# Patient Record
Sex: Female | Born: 1935 | Race: White | Hispanic: No | Marital: Married | Smoking: Never smoker
Health system: Southern US, Community
[De-identification: ages and names within clinical notes are randomized; demographics above are authoritative.]

## PROBLEM LIST (undated history)

## (undated) DIAGNOSIS — R413 Other amnesia: Secondary | ICD-10-CM

## (undated) DIAGNOSIS — M1990 Unspecified osteoarthritis, unspecified site: Secondary | ICD-10-CM

## (undated) DIAGNOSIS — R151 Fecal smearing: Secondary | ICD-10-CM

## (undated) DIAGNOSIS — E871 Hypo-osmolality and hyponatremia: Secondary | ICD-10-CM

## (undated) DIAGNOSIS — C787 Secondary malignant neoplasm of liver and intrahepatic bile duct: Secondary | ICD-10-CM

## (undated) DIAGNOSIS — R1312 Dysphagia, oropharyngeal phase: Secondary | ICD-10-CM

## (undated) DIAGNOSIS — K219 Gastro-esophageal reflux disease without esophagitis: Secondary | ICD-10-CM

## (undated) DIAGNOSIS — I10 Essential (primary) hypertension: Secondary | ICD-10-CM

## (undated) DIAGNOSIS — M5126 Other intervertebral disc displacement, lumbar region: Secondary | ICD-10-CM

## (undated) DIAGNOSIS — E785 Hyperlipidemia, unspecified: Secondary | ICD-10-CM

## (undated) DIAGNOSIS — D3A8 Other benign neuroendocrine tumors: Secondary | ICD-10-CM

## (undated) DIAGNOSIS — R269 Unspecified abnormalities of gait and mobility: Secondary | ICD-10-CM

## (undated) DIAGNOSIS — R42 Dizziness and giddiness: Secondary | ICD-10-CM

## (undated) DIAGNOSIS — L578 Other skin changes due to chronic exposure to nonionizing radiation: Secondary | ICD-10-CM

## (undated) DIAGNOSIS — K621 Rectal polyp: Secondary | ICD-10-CM

## (undated) DIAGNOSIS — R55 Syncope and collapse: Secondary | ICD-10-CM

## (undated) DIAGNOSIS — M25569 Pain in unspecified knee: Secondary | ICD-10-CM

## (undated) DIAGNOSIS — IMO0002 Reserved for concepts with insufficient information to code with codable children: Secondary | ICD-10-CM

## (undated) DIAGNOSIS — L853 Xerosis cutis: Secondary | ICD-10-CM

## (undated) DIAGNOSIS — K5900 Constipation, unspecified: Secondary | ICD-10-CM

## (undated) DIAGNOSIS — R7989 Other specified abnormal findings of blood chemistry: Secondary | ICD-10-CM

## (undated) DIAGNOSIS — F329 Major depressive disorder, single episode, unspecified: Secondary | ICD-10-CM

## (undated) DIAGNOSIS — R21 Rash and other nonspecific skin eruption: Secondary | ICD-10-CM

## (undated) DIAGNOSIS — R195 Other fecal abnormalities: Secondary | ICD-10-CM

## (undated) DIAGNOSIS — N189 Chronic kidney disease, unspecified: Secondary | ICD-10-CM

## (undated) DIAGNOSIS — R7309 Other abnormal glucose: Secondary | ICD-10-CM

## (undated) DIAGNOSIS — K5090 Crohn's disease, unspecified, without complications: Secondary | ICD-10-CM

## (undated) DIAGNOSIS — M170 Bilateral primary osteoarthritis of knee: Secondary | ICD-10-CM

## (undated) DIAGNOSIS — D128 Benign neoplasm of rectum: Secondary | ICD-10-CM

## (undated) DIAGNOSIS — M545 Low back pain: Secondary | ICD-10-CM

## (undated) DIAGNOSIS — K620 Anal polyp: Secondary | ICD-10-CM

## (undated) DIAGNOSIS — L659 Nonscarring hair loss, unspecified: Secondary | ICD-10-CM

## (undated) HISTORY — PX: KNEE ARTHROSCOPY: SHX127

## (undated) HISTORY — DX: Hyperlipidemia, unspecified: E78.5

## (undated) HISTORY — DX: Essential (primary) hypertension: I10

## (undated) HISTORY — DX: Anal polyp: K62.0

## (undated) HISTORY — DX: Secondary malignant neoplasm of liver and intrahepatic bile duct: C78.7

## (undated) HISTORY — DX: Other benign neuroendocrine tumors: D3A.8

## (undated) HISTORY — DX: Nonscarring hair loss, unspecified: L65.9

## (undated) HISTORY — PX: COLONOSCOPY: SHX174

## (undated) HISTORY — DX: Other specified abnormal findings of blood chemistry: R79.89

## (undated) HISTORY — DX: Reserved for concepts with insufficient information to code with codable children: IMO0002

## (undated) HISTORY — DX: Gastro-esophageal reflux disease without esophagitis: K21.9

## (undated) HISTORY — PX: VAGINAL HYSTERECTOMY: SUR661

## (undated) HISTORY — DX: Crohn's disease, unspecified, without complications: K50.90

## (undated) HISTORY — DX: Dysphagia, oropharyngeal phase: R13.12

## (undated) HISTORY — DX: Benign neoplasm of rectum: D12.8

## (undated) HISTORY — DX: Fecal smearing: R15.1

## (undated) HISTORY — DX: Rectal polyp: K62.1

## (undated) HISTORY — DX: Other amnesia: R41.3

## (undated) HISTORY — PX: MASTECTOMY PARTIAL / LUMPECTOMY: SUR851

## (undated) HISTORY — DX: Syncope and collapse: R55

## (undated) HISTORY — DX: Other intervertebral disc displacement, lumbar region: M51.26

## (undated) HISTORY — DX: Unspecified abnormalities of gait and mobility: R26.9

## (undated) HISTORY — DX: Low back pain: M54.5

## (undated) HISTORY — DX: Other fecal abnormalities: R19.5

## (undated) HISTORY — DX: Pain in unspecified knee: M25.569

## (undated) HISTORY — DX: Hypo-osmolality and hyponatremia: E87.1

## (undated) HISTORY — DX: Constipation, unspecified: K59.00

## (undated) HISTORY — DX: Major depressive disorder, single episode, unspecified: F32.9

## (undated) HISTORY — PX: TONSILLECTOMY AND ADENOIDECTOMY: SHX28

## (undated) HISTORY — DX: Rash and other nonspecific skin eruption: R21

## (undated) HISTORY — DX: Dizziness and giddiness: R42

## (undated) HISTORY — PX: POLYPECTOMY: SHX149

## (undated) HISTORY — PX: APPENDECTOMY: SHX54

## (undated) HISTORY — DX: Other abnormal glucose: R73.09

## (undated) HISTORY — DX: Bilateral primary osteoarthritis of knee: M17.0

## (undated) HISTORY — DX: Xerosis cutis: L85.3

---

## 2009-12-22 DIAGNOSIS — L659 Nonscarring hair loss, unspecified: Secondary | ICD-10-CM

## 2009-12-22 HISTORY — DX: Nonscarring hair loss, unspecified: L65.9

## 2010-11-20 DIAGNOSIS — M545 Low back pain, unspecified: Secondary | ICD-10-CM

## 2010-11-20 DIAGNOSIS — R21 Rash and other nonspecific skin eruption: Secondary | ICD-10-CM

## 2010-11-20 HISTORY — DX: Low back pain, unspecified: M54.50

## 2010-11-20 HISTORY — DX: Rash and other nonspecific skin eruption: R21

## 2010-11-21 DIAGNOSIS — E785 Hyperlipidemia, unspecified: Secondary | ICD-10-CM

## 2010-11-21 DIAGNOSIS — R7989 Other specified abnormal findings of blood chemistry: Secondary | ICD-10-CM

## 2010-11-21 HISTORY — DX: Other specified abnormal findings of blood chemistry: R79.89

## 2010-11-21 HISTORY — DX: Hyperlipidemia, unspecified: E78.5

## 2010-11-26 DIAGNOSIS — IMO0002 Reserved for concepts with insufficient information to code with codable children: Secondary | ICD-10-CM

## 2010-11-26 HISTORY — DX: Reserved for concepts with insufficient information to code with codable children: IMO0002

## 2010-12-23 DIAGNOSIS — R269 Unspecified abnormalities of gait and mobility: Secondary | ICD-10-CM

## 2010-12-23 DIAGNOSIS — R151 Fecal smearing: Secondary | ICD-10-CM

## 2010-12-23 HISTORY — DX: Unspecified abnormalities of gait and mobility: R26.9

## 2010-12-23 HISTORY — DX: Fecal smearing: R15.1

## 2011-02-24 DIAGNOSIS — R55 Syncope and collapse: Secondary | ICD-10-CM

## 2011-02-24 DIAGNOSIS — R42 Dizziness and giddiness: Secondary | ICD-10-CM

## 2011-02-24 HISTORY — DX: Syncope and collapse: R55

## 2011-02-24 HISTORY — DX: Dizziness and giddiness: R42

## 2011-05-14 ENCOUNTER — Other Ambulatory Visit: Payer: Self-pay | Admitting: Internal Medicine

## 2011-05-14 DIAGNOSIS — R1312 Dysphagia, oropharyngeal phase: Secondary | ICD-10-CM

## 2011-05-14 DIAGNOSIS — K219 Gastro-esophageal reflux disease without esophagitis: Secondary | ICD-10-CM

## 2011-05-14 HISTORY — DX: Dysphagia, oropharyngeal phase: R13.12

## 2011-05-15 ENCOUNTER — Ambulatory Visit
Admission: RE | Admit: 2011-05-15 | Discharge: 2011-05-15 | Disposition: A | Discharge status: Home / Self Care | Payer: Medicare Other | Source: Ambulatory Visit | Attending: Internal Medicine | Admitting: Internal Medicine

## 2011-05-15 DIAGNOSIS — K219 Gastro-esophageal reflux disease without esophagitis: Secondary | ICD-10-CM

## 2011-06-13 ENCOUNTER — Inpatient Hospital Stay (HOSPITAL_COMMUNITY)
Admission: EM | Admit: 2011-06-13 | Discharge: 2011-06-15 | DRG: 917 | Disposition: A | Discharge status: Home / Self Care | Payer: Medicare Other | Attending: Internal Medicine | Admitting: Internal Medicine

## 2011-06-13 ENCOUNTER — Emergency Department (HOSPITAL_COMMUNITY): Payer: Medicare Other

## 2011-06-13 ENCOUNTER — Inpatient Hospital Stay (HOSPITAL_COMMUNITY): Payer: Medicare Other

## 2011-06-13 DIAGNOSIS — T61771A Other fish poisoning, accidental (unintentional), initial encounter: Secondary | ICD-10-CM | POA: Diagnosis present

## 2011-06-13 DIAGNOSIS — K219 Gastro-esophageal reflux disease without esophagitis: Secondary | ICD-10-CM | POA: Diagnosis present

## 2011-06-13 DIAGNOSIS — M549 Dorsalgia, unspecified: Secondary | ICD-10-CM | POA: Diagnosis present

## 2011-06-13 DIAGNOSIS — G8929 Other chronic pain: Secondary | ICD-10-CM | POA: Diagnosis present

## 2011-06-13 DIAGNOSIS — Z79899 Other long term (current) drug therapy: Secondary | ICD-10-CM

## 2011-06-13 DIAGNOSIS — K5289 Other specified noninfective gastroenteritis and colitis: Secondary | ICD-10-CM | POA: Diagnosis present

## 2011-06-13 DIAGNOSIS — J690 Pneumonitis due to inhalation of food and vomit: Secondary | ICD-10-CM | POA: Diagnosis not present

## 2011-06-13 DIAGNOSIS — E876 Hypokalemia: Secondary | ICD-10-CM | POA: Diagnosis not present

## 2011-06-13 DIAGNOSIS — D72829 Elevated white blood cell count, unspecified: Secondary | ICD-10-CM | POA: Diagnosis present

## 2011-06-13 DIAGNOSIS — R9431 Abnormal electrocardiogram [ECG] [EKG]: Secondary | ICD-10-CM | POA: Diagnosis not present

## 2011-06-13 DIAGNOSIS — E860 Dehydration: Secondary | ICD-10-CM | POA: Diagnosis present

## 2011-06-13 DIAGNOSIS — I4891 Unspecified atrial fibrillation: Secondary | ICD-10-CM | POA: Diagnosis present

## 2011-06-13 DIAGNOSIS — T6191XA Toxic effect of unspecified seafood, accidental (unintentional), initial encounter: Principal | ICD-10-CM | POA: Diagnosis present

## 2011-06-13 DIAGNOSIS — I10 Essential (primary) hypertension: Secondary | ICD-10-CM | POA: Diagnosis present

## 2011-06-13 DIAGNOSIS — N390 Urinary tract infection, site not specified: Secondary | ICD-10-CM | POA: Diagnosis present

## 2011-06-13 DIAGNOSIS — Y92009 Unspecified place in unspecified non-institutional (private) residence as the place of occurrence of the external cause: Secondary | ICD-10-CM

## 2011-06-13 LAB — URINALYSIS, ROUTINE W REFLEX MICROSCOPIC
Bilirubin Urine: NEGATIVE
Nitrite: POSITIVE — AB
Specific Gravity, Urine: 1.014 (ref 1.005–1.030)
Urobilinogen, UA: 0.2 mg/dL (ref 0.0–1.0)
pH: 6 (ref 5.0–8.0)

## 2011-06-13 LAB — COMPREHENSIVE METABOLIC PANEL
BUN: 19 mg/dL (ref 6–23)
CO2: 24 mEq/L (ref 19–32)
Calcium: 9.4 mg/dL (ref 8.4–10.5)
Creatinine, Ser: 0.72 mg/dL (ref 0.50–1.10)
GFR calc Af Amer: >60 mL/min (ref >60)
GFR calc non Af Amer: >60 mL/min (ref >60)
Glucose, Bld: 138 mg/dL — ABNORMAL HIGH (ref 70–99)
Total Protein: 6.8 g/dL (ref 6.0–8.3)

## 2011-06-13 LAB — DIFFERENTIAL
Basophils Relative: 0 % (ref 0–1)
Eosinophils Relative: 0 % (ref 0–5)
Lymphocytes Relative: 8 % — ABNORMAL LOW (ref 12–46)
Neutro Abs: 16.4 10*3/uL — ABNORMAL HIGH (ref 1.7–7.7)
Neutrophils Relative %: 85 % — ABNORMAL HIGH (ref 43–77)

## 2011-06-13 LAB — CBC
Hemoglobin: 13.9 g/dL (ref 12.0–15.0)
MCH: 31 pg (ref 26.0–34.0)
MCHC: 35.6 g/dL (ref 30.0–36.0)
MCV: 86.9 fL (ref 78.0–100.0)
RBC: 4.49 MIL/uL (ref 3.87–5.11)

## 2011-06-13 LAB — URINE MICROSCOPIC-ADD ON

## 2011-06-14 ENCOUNTER — Inpatient Hospital Stay (HOSPITAL_COMMUNITY): Payer: Medicare Other

## 2011-06-14 DIAGNOSIS — R9431 Abnormal electrocardiogram [ECG] [EKG]: Secondary | ICD-10-CM

## 2011-06-14 LAB — CARDIAC PANEL(CRET KIN+CKTOT+MB+TROPI)
CK, MB: 5.9 ng/mL — ABNORMAL HIGH (ref 0.3–4.0)
Relative Index: 4.1 — ABNORMAL HIGH (ref 0.0–2.5)
Relative Index: 4.4 — ABNORMAL HIGH (ref 0.0–2.5)
Total CK: 139 U/L (ref 7–177)
Troponin I: <0.3 ng/mL (ref <0.30)
Troponin I: <0.3 ng/mL (ref <0.30)
Troponin I: <0.3 ng/mL (ref <0.30)

## 2011-06-14 LAB — BASIC METABOLIC PANEL
Calcium: 8.6 mg/dL (ref 8.4–10.5)
Chloride: 101 mEq/L (ref 96–112)
Creatinine, Ser: 0.84 mg/dL (ref 0.50–1.10)
GFR calc Af Amer: >60 mL/min (ref >60)

## 2011-06-14 LAB — CBC
Hemoglobin: 13.4 g/dL (ref 12.0–15.0)
MCH: 30.5 pg (ref 26.0–34.0)
RBC: 4.39 MIL/uL (ref 3.87–5.11)

## 2011-06-14 LAB — MAGNESIUM
Magnesium: 1.9 mg/dL (ref 1.5–2.5)
Magnesium: 1.9 mg/dL (ref 1.5–2.5)

## 2011-06-15 LAB — BASIC METABOLIC PANEL
Calcium: 8.4 mg/dL (ref 8.4–10.5)
Creatinine, Ser: 0.88 mg/dL (ref 0.50–1.10)
GFR calc non Af Amer: >60 mL/min (ref >60)
Glucose, Bld: 88 mg/dL (ref 70–99)
Sodium: 139 mEq/L (ref 135–145)

## 2011-06-15 LAB — DIFFERENTIAL
Basophils Absolute: 0 10*3/uL (ref 0.0–0.1)
Basophils Relative: 0 % (ref 0–1)
Neutro Abs: 5.5 10*3/uL (ref 1.7–7.7)
Neutrophils Relative %: 53 % (ref 43–77)

## 2011-06-15 LAB — CARDIAC PANEL(CRET KIN+CKTOT+MB+TROPI)
CK, MB: 4.4 ng/mL — ABNORMAL HIGH (ref 0.3–4.0)
Relative Index: INVALID (ref 0.0–2.5)
Total CK: 75 U/L (ref 7–177)

## 2011-06-15 LAB — URINE CULTURE: Culture  Setup Time: 201209011126

## 2011-06-15 LAB — CBC
Hemoglobin: 12.1 g/dL (ref 12.0–15.0)
RBC: 4.08 MIL/uL (ref 3.87–5.11)

## 2011-06-19 NOTE — Discharge Summary (Signed)
[name], [name]:  [id number]  MEDICAL RECORD NO.:  [id number]  LOCATION:  6709                         FACILITY:  [facility]  PHYSICIAN:  Heather Harvest, MD    DATE OF BIRTH:  [birth year]  DATE OF ADMISSION:  06/13/2011 DATE OF DISCHARGE:  06/15/2011                        DISCHARGE SUMMARY    PRIMARY CARE PHYSICIAN:  Heather Curt. Chilton Si, MD  DISCHARGE DIAGNOSES: 1. Probable gastroenteritis versus food poisoning, resolved. 2. Urinary tract infection. 3. Hypokalemia, repleted. 4. Probable aspiration pneumonia. 5. Abnormal EKG. 6. Gastroesophageal reflux disease. 7. Chronic back pain. 8. Hypertension. 9. Status post arthroscopic surgery of both knees. 10.History of L3 compression fracture. 11.Status post tonsillectomy. 12.Status post hysterectomy. 13.Status post benign right breast lumpectomy.  DISCHARGE MEDICATIONS: 1. Levaquin 500 mg p.o. daily x6 days. 2. Aleve 220 mg 2 tablets p.o. daily p.r.n. 3. Biotin 5000 mcg p.o. daily. 4. Bystolic 10 mg p.o. daily. 5. Meclizine 25 mg p.o. q.i.d. p.r.n. 6. Multivitamin 1 tablet p.o. daily. 7. Nifedipine XR 30 mg p.o. daily. 8. Omeprazole 20 mg p.o. b.i.d. 9. Vitamin D2 50,000 units p.o. q.2 weeks.  DISPOSITION AND FOLLOWUP:  The patient will be discharged back home to [facility].  The patient and daughter were insistent on the patient being discharged today June 15, 2011.  The patient is to follow up with PCP 1 week post discharge.  The patient's left basilar pneumonia will need to be reassessed.  Urine cultures will need to be followed up upon per PCP as urine cultures were not back by day of discharge.  The patient on followup with PCP will need a BMET checked to follow up on electrolytes and renal function and will also need a CBC checked to follow up on a leukocytosis.  CONSULTATIONS DONE:  Cardiology consultation was done.  The patient was seen in consultation by Dr.  Graciela Parker of [facility] on June 14, 2011.  PROCEDURES PERFORMED:  Abdominal x-ray was done on June 13, 2011, that showed no evidence of obstruction.  No acute findings.  Acute abdominal series done on June 13, 2011, shows moderate gaseous distention of the stomach.  No evidence of small-bowel obstruction.  No evidence of active pulmonary disease.  Acute abdominal series done on June 14, 2011, shows improved gaseous distention of the stomach, nonspecific bowel gas pattern with borderline small bowel dilatation, left base air space disease is new, suspicious for infection or aspiration.  BRIEF ADMISSION HISTORY AND PHYSICAL:  [name] is an 77 year old Caucasian female with history of hypertension living in the [facility].  History was very limited secondary to the patient's feeling weak and unable to give much of the history per admitting physician.  Apparently, her symptoms started on the Wednesday prior to admission with 4 bouts of diarrhea with no associated abdominal pain.  At that time, the patient felt mildly nauseated.  The patient also stated that she ate crab cake the night prior to admission and since then she started to have persistent nausea and vomiting.  The patient denied any bloody vomitus.  Denied any abdominal pain.  The patient had no bowel movement.  On the  day of admission, the patient had another episode of watery diarrhea, but no abdominal pain.  The patient denied any chest pain or shortness of breath.  The patient was unable to keep anything down and the nausea made the patient feel really sick and weak.  During the conversation, the patient was unable to give as much history and she was very lethargic and unable to speak to admitting physician.  For the rest of admission history and physical, please see H and P dictated by Dr. Eda Paschal Parker of job number [id number].  HOSPITAL COURSE: 1. Probable  gastroenteritis versus food poisoning.  The patient was     admitted with nausea, vomiting, and diarrhea; however, the patient     did not have any further diarrhea during the hospitalization and C.     diff PCR was ordered, however, due to no diarrhea I was unable to     get this.  The patient did not have any abdominal pain.  She was     placed on supportive care with IV fluids and antiemetics.  She was     monitored and followed.  The patient improved clinically.  Diet was     subsequently placed on clear liquids, which was subsequently     advanced.  The patient tolerated diet without any more further     nausea or abdominal pain.  The patient will be discharged home in     stable and improved condition. 2. Urinary tract infection.  On the workup, the patient was noted to     have urinary tract infection per urinalysis which was done, nitrite     was positive.  She did have large leukocytes, WBCs of 11-20.  The     patient on admission did have leukocytosis with a white count of     19.2.  The patient however remained afebrile.  The patient was     placed on IV Rocephin during this hospitalization.  Urine cultures     were pending at the time of discharge.  The patient and family were     insistent on the patient being discharged on June 15, 2011, and     as such urine cultures were pending at this time.  The patient will     be discharged home on oral Levaquin to cover for UTI and aspiration     pneumonia.  The patient's PCP will need to follow up on urine     cultures.  The patient will be discharged in stable and improved     condition.  The patient's white count on the day of discharge was     down to 10.4. 3. Hypokalemia.  On the day of discharge, the patient was noted to be     hypokalemic with a potassium of 3.4.  The patient's potassium has     been repleted and the patient will need a followup BMET and CBC     done to follow up on her hypokalemia. 4. Probable aspiration  pneumonia.  The patient during the     hospitalization on admission did have a leukocytosis as high as     19.2.  Repeat acute abdominal series, which was done on June 14, 2011, did show new left basilar airspace disease, which is     likely secondary to aspiration pneumonia secondary to the patient's     nausea and emesis.  The patient remained afebrile.  She had been on  IV Rocephin during the hospitalization.  Her leukocytosis improved.     She will be discharged home on 6 more days of oral Levaquin to     complete a 7-day course of antibiotic therapy.  The patient will     likely need repeat chest x-rays done in about 4 weeks to follow up     on her pneumonia.  The patient also need to follow up with PCP as     an outpatient. 5. Abnormal EKG.  On admission, the patient did have an abnormal EKG     with some ST depressions in leads V5 through V6.  The patient due     to nausea, vomiting, and abnormal EKG with no prior EKG to compare     it to, cardiac enzymes were cycled to rule out coronary artery     disease.  Cardiac enzymes were negative x3.  Cardiology     consultation was obtained secondary to abnormal EKG.  The patient     was seen in consultation with Dr. Graciela Parker of [facility].     Repeat EKG, which was done did show improvement from a prior EKG.     It was felt per Cardiology that no further cardiac workup was     needed at this time and as such the patient will be discharged home     in stable condition.  The rest of the patient's chronic medical     issues remained stable throughout the hospitalization.  On the day of discharge, vital signs temperature 98.2, pulse of 80, blood pressure 139/80, respirations 20, saturating 96% on room air.  DISCHARGE LABS:  Sodium 139, potassium 3.4, chloride 106, bicarb 24, BUN 13, creatinine 0.88, glucose of 88, calcium of 8.4.  CBC with a white count of 10.4, hemoglobin 12.1, hematocrit 36, platelet count of 234.  It  has been a pleasure taking care of [name].     Heather Harvest, MD     DT/MEDQ  D:  06/15/2011  T:  06/15/2011  Job:  [id number]  cc:   Heather Curt Chilton Parker, M.D. Duke Salvia, MD, [facility]  Electronically Signed by Heather Harvest MD on 06/19/2011 12:24:48 PM

## 2011-06-20 LAB — CULTURE, BLOOD (ROUTINE X 2)
Culture  Setup Time: 201209011127
Culture: NO GROWTH

## 2011-06-25 DIAGNOSIS — R195 Other fecal abnormalities: Secondary | ICD-10-CM

## 2011-06-25 HISTORY — DX: Other fecal abnormalities: R19.5

## 2011-07-23 DIAGNOSIS — M25569 Pain in unspecified knee: Secondary | ICD-10-CM

## 2011-07-23 HISTORY — DX: Pain in unspecified knee: M25.569

## 2011-07-28 NOTE — H&P (Signed)
Heather Parker, Heather Parker                ACCOUNT NO.:  [id number]  MEDICAL RECORD NO.:  [id number]  LOCATION:  [facility]                         FACILITY:  [facility]  PHYSICIAN:  Drisana Schweickert I Virgilene Stryker, MD      DATE OF BIRTH:  04-18-1922  DATE OF ADMISSION:  06/13/2011 DATE OF DISCHARGE:                             HISTORY & PHYSICAL   PRIMARY CARE PHYSICIAN:  [name], M.D.  CHIEF COMPLAINT:  Nausea, vomiting, and diarrhea for 2 days.  HISTORY OF PRESENT ILLNESS:  This is an 77 year old female with history of hypertension, the patient lives in [facility] as independent living.  History is very limited secondary to patient feeling weak and unable to give much history at this time.  Apparently, her symptoms started on Wednesday with 4 bouts of diarrhea, not associated with abdominal pain.  At that time, she felt mildly nauseated.  The patient admitted yesterday.  She ate crab cake last night and since then she started to have persistent nausea and vomiting. Denies any bloody vomitus.  Denies any abdominal pain.  Per patient, Thursday had no bowel movement.  Today she had another one episode of watery diarrhea, but per the patient there is no abdominal pain.  The patient denies any chest pain or shortness of breath, unable to keep anything down and nausea makes the patient feel really sick and weak. During conversation, the patient is unable to give [place] much history as she was very lethargic and unable to talk to [place].  PAST MEDICAL HISTORY: 1. Chronic back pain. 2. Gastroesophageal reflux disease. 3. Hypertension. 4. Status post arthroscopic surgery of both knees. 5. L3 compression fracture. 6. Status post tonsillectomy. 7. Hysterectomy. 8. Status post benign right breast lumpectomy.  ALLERGIES:  DRAMAMINE and CODEINE.  PAST SURGICAL HISTORY:  As above.  MEDICATIONS: 1. Bystolic 15 mg p.o. q.a.m. 2. Lisinopril 5 mg q.a.m. 3. Nifedipine 30 mg q.a.m. 4. Fish oil. 5.  Vitamin D. 6. Multivitamin.  DIET:  The patient on regular diet.  SOCIAL HISTORY:  The patient lives in the [facility].  She is independent in her living.  Denies any smoking or alcohol abuse or drug abuse.  FAMILY HISTORY:  Unable to obtain at this time.  PHYSICAL EXAMINATION:  VITAL SIGNS:  Temperature 99, blood pressure 155/72, pulse rate 82, respiratory rate 20, satting 98% on room air. HEENT:  Normocephalic, atraumatic.  Pupils are equal and reactive to light and accommodation now.  Mouth looks dry.  No oral thrush.  No masses.  No tonsillar hypertrophy. NECK:  Supple.  No lymphadenopathy.  No masses. LUNGS:  Normal vesicular breathing with equal air entry. ABDOMEN:  Soft, nondistended, and nontender.  Bowel sounds present.  No rebound.  No guarding.  No organomegaly. EXTREMITIES:  Without lower limb edema.  Peripheral pulses are intact. CNS:  The patient is able to move all her upper and lower extremities.  LABORATORIES:  Blood workup did show white blood cells 19.2, hemoglobin 13.9, hematocrit 39, platelet 268.  Sodium 131, potassium 4.1, chloride 95, CO2 24, glucose 138, BUN 19, creatinine 0.72.  LFTs within normal. Urinalysis, both nitrite and leukocyte.  EKG  did show AFib, left anterior fascicular block, no previous EKG to compare, but the patient denied any chest pain.  ASSESSMENT AND PLAN:  This is an 78 year old female from retirement community, independent on her living, presented with diarrhea, nausea, and vomiting, found to have dehydration.  Current diagnosis will be; 1. Nausea, vomiting, and diarrhea.  Rule out virus versus Clostridium     difficile versus gastritis.  The patient as we mentioned denies     any abdominal pain.  Accordingly, we will admit the patient for     further support with IV fluid and antiemetic medications.  The     patient noticed to have elevated white blood cells, could be     secondary to urinary tract infection  or reactive.  I will get the     stool studies for C. diff and stool for Gram-stain and culture.  We     will start the patient on Rocephin 1 g IV to cover for urinary     tract infection, pending culture and sensitivity. 2. Hypertension.  We will continue with the patient's home     medications. 3. EKG suggest some atrial fibrillation, but the rate under control.     No previous history.  I will assess the patient on tele monitor and     cycle the patient's cardiac enzymes.  Further recommendation per     hospital course progress.  As we mentioned, the history is very     limited secondary to the patient's lethargy.     Abhijot Straughter Bosie Helper, MD     HIE/MEDQ  D:  06/13/2011  T:  06/13/2011  Job:  [id number]  cc:   [name], M.D.  Electronically Signed by Ebony Cargo MD on 07/28/2011 04:31:32 PM

## 2011-10-14 HISTORY — PX: OTHER SURGICAL HISTORY: SHX169

## 2011-10-15 ENCOUNTER — Encounter: Payer: Self-pay | Admitting: Internal Medicine

## 2011-10-29 ENCOUNTER — Encounter: Payer: Self-pay | Admitting: Internal Medicine

## 2011-11-03 ENCOUNTER — Ambulatory Visit (INDEPENDENT_AMBULATORY_CARE_PROVIDER_SITE_OTHER): Payer: Medicare Other | Admitting: Internal Medicine

## 2011-11-03 ENCOUNTER — Encounter: Payer: Self-pay | Admitting: Internal Medicine

## 2011-11-03 ENCOUNTER — Encounter: Payer: Self-pay | Admitting: Gastroenterology

## 2011-11-03 DIAGNOSIS — K629 Disease of anus and rectum, unspecified: Secondary | ICD-10-CM

## 2011-11-03 DIAGNOSIS — I10 Essential (primary) hypertension: Secondary | ICD-10-CM | POA: Insufficient documentation

## 2011-11-03 DIAGNOSIS — Z860101 Personal history of adenomatous and serrated colon polyps: Secondary | ICD-10-CM

## 2011-11-03 DIAGNOSIS — R1013 Epigastric pain: Secondary | ICD-10-CM

## 2011-11-03 DIAGNOSIS — M1990 Unspecified osteoarthritis, unspecified site: Secondary | ICD-10-CM | POA: Insufficient documentation

## 2011-11-03 DIAGNOSIS — K219 Gastro-esophageal reflux disease without esophagitis: Secondary | ICD-10-CM

## 2011-11-03 DIAGNOSIS — Z86010 Personal history of colonic polyps: Secondary | ICD-10-CM | POA: Insufficient documentation

## 2011-11-03 DIAGNOSIS — IMO0002 Reserved for concepts with insufficient information to code with codable children: Secondary | ICD-10-CM | POA: Insufficient documentation

## 2011-11-03 DIAGNOSIS — K6289 Other specified diseases of anus and rectum: Secondary | ICD-10-CM

## 2011-11-03 DIAGNOSIS — R151 Fecal smearing: Secondary | ICD-10-CM

## 2011-11-03 DIAGNOSIS — K649 Unspecified hemorrhoids: Secondary | ICD-10-CM | POA: Insufficient documentation

## 2011-11-03 MED ORDER — SIMETHICONE 80 MG PO CHEW: 80.0000 mg | CHEWABLE_TABLET | Freq: Two times a day (BID) | ORAL | Status: AC | PRN

## 2011-11-03 MED ORDER — PANTOPRAZOLE SODIUM 40 MG PO TBEC: 40.0000 mg | DELAYED_RELEASE_TABLET | Freq: Every day | ORAL | Status: DC

## 2011-11-03 MED ORDER — PEG-KCL-NACL-NASULF-NA ASC-C 100 G PO SOLR: 1.0000 | Freq: Once | ORAL | Status: DC

## 2011-11-03 NOTE — Progress Notes (Signed)
Subjective:    Patient ID: Heather Parker, female    DOB: 1922/03/02, 78 y.o.   MRN: [id number]  HPI Heather Parker is an 78 yo female with PMH of HTN, GERD, hemorrhoids, osteoarthritis who seen in consultation at the request of Dr. Chilton Si for evaluation of indigestion.  The patient states that she has had many years of heartburn, however over the past 2 or so months she reports worsening of her "indigestion". To her indigestion is a burning pain radiating from the epigastrium up to her substernal chest. This is worse at night and several hours after meals. She reports waking on multiple occasions with severe indigestion, and for this she remained upright for one or 2 hours before she goes back to sleep. She reports having tried Prilosec 20 mg for about 6 weeks with no improvement. She does report significant belching. She denies dysphagia or odynophagia. She's had some nausea but no vomiting. Her appetite is "okay". Her weight is stable and she has not lost weight. She does report black stools in September of 2012, but per report this was heme-negative. She is no longer having black stools, but notes and B. dark brown. She does report overall her bowel movements are normal, however over the past few months she's had some fecal seepage. This is involuntary and she notes that she sees staining in her underwear. She denies rectal bleeding. No pain with defecation.  Of note she was hospitalized in late August 2012 for all is felt to be viral gastroenteritis. This was primarily manifested by nausea. There was some question of aspiration pneumonia during this hospitalization. Also during this time she underwent a barium swallow which was reportedly normal other than mild reflux.  Review of Systems Constitutional: Negative for fever, chills, night sweats, activity change, appetite change and unexpected weight change HEENT: Negative for sore throat, mouth sores and trouble swallowing. Eyes: Negative for visual  disturbance Respiratory: Negative for cough, chest tightness and shortness of breath Cardiovascular: Negative for chest pain, palpitations , positive for mild lower extremity swell Gastrointestinal: See history of present illness Genitourinary: Negative for dysuria and hematuria. Musculoskeletal: Positive for for back pain and arthralgias, negative for myalgias Skin: Negative for rash or color change Neurological: Negative for headaches, weakness, numbness Hematological: Negative for adenopathy, negative for easy bruising/bleeding Psychiatric/behavioral: Negative for depressed mood, negative for anxiety  PMH: HTN GERD Osteoarthritis Hemorrhoids Lumbar disc disease  Past Surgical History  Procedure Date  . Appendectomy   . Vaginal hysterectomy   . Knee arthroscopy     Bi-lat  . Tonsillectomy and adenoidectomy    Current Outpatient Prescriptions  Medication Sig Dispense Refill  . Biotin 5000 MCG CAPS Take 1 capsule by mouth.      . ergocalciferol (VITAMIN D2) 50000 UNITS capsule Take 50,000 Units by mouth once a week.      . meclizine (ANTIVERT) 25 MG tablet Take 25 mg by mouth.      . Multiple Vitamins-Minerals (CENTRUM PO) Take 1 tablet by mouth daily.      . naproxen sodium (ANAPROX) 220 MG tablet Take 220 mg by mouth 2 (two) times daily with a meal.      . nebivolol (BYSTOLIC) 10 MG tablet Take 10 mg by mouth daily.      Marland Kitchen NIFEdipine (PROCARDIA-XL/ADALAT CC) 30 MG 24 hr tablet Take 30 mg by mouth daily.      . pantoprazole (PROTONIX) 40 MG tablet Take 1 tablet (40 mg total) by mouth daily.  30 tablet  5  . peg 3350 powder (MOVIPREP) 100 G SOLR Take 1 kit (100 g total) by mouth once.  1 kit  0  . simethicone (GAS-X) 80 MG chewable tablet Chew 1 tablet (80 mg total) by mouth every 12 (twelve) hours as needed for flatulence (Take 1-2 daily as needed ).  30 tablet  5   Allergies  Allergen Reactions  . Codeine Nausea And Vomiting  . Darvocet (Propoxyphene N-Acetaminophen)      Caused her to pass out  . Darvon    Family History  Problem Relation Age of Onset  . Stomach cancer Sister   . Lung cancer Brother   . Heart disease Father    SH - patient is a resident of [facility].  She previously lived in [place] to move to be closer to family. Never a smoker nor an alcohol drinker. No illicit drug use.    Objective:   Physical Exam BP 130/80  Pulse 67  Ht 5\' 1"  (1.549 m)  Wt 156 lb (70.761 kg)  BMI 29.48 kg/m2 Constitutional: Well-developed and well-nourished. No distress. HEENT: Normocephalic and atraumatic. Oropharynx is clear and moist. No oropharyngeal exudate. Conjunctivae are normal. Pupils are equal round and reactive to light. No scleral icterus. Neck: Neck supple. Trachea midline. Cardiovascular: Normal rate, regular rhythm and intact distal pulses. No M/R/G Pulmonary/chest: Effort normal and breath sounds normal. No wheezing, rales or rhonchi. Abdominal: Soft, very mild epigastric tenderness to deep palpation without rebound or guarding, nondistended. Bowel sounds active throughout. There are no masses palpable. No hepatosplenomegaly. Rectal: No external tenderness or abnormalities noted, decreased rectal tone, palpable ridge just inside rectal vault unable to discern if this is skin tag/internal hemorrhoid or polyp  Extremities: no clubbing, cyanosis, trace pretibial edema Lymphadenopathy: No cervical adenopathy noted. Neurological: Alert and oriented to person place and time. Skin: Skin is warm and dry. No rashes noted. Psychiatric: Normal mood and affect. Behavior is normal.  Previous gastroenterology records from Dr. Ginger Organ, MD EGD 12/17/2005 --the esophagus was intubated with the aid of a savory guidewire which advanced into the stomach without resistance, the scope was advanced over the guidewire with mild resistance into the esophagus. The entire examined stomach was normal. The cardia and gastric fundus  were normal on retroflexion. The duodenal bulb and second part of the duodenum were normal. A benign appearing, intrinsic mild stenosis was found and traversed. This was successfully dilated over a guidewire with a 54 French savory dilator with mild resistance. Estimated blood loss was minimal.  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 Resected and retrieved. One 20 mm polyp in the rectum. Resected and retrieved. Diverticulosis.  Pathology: Descending colon polyps = adenomatous polyps.  Rectal polyps = tubulovillous adenoma    Assessment & Plan:  78 yo female with PMH of HTN, GERD, hemorrhoids, osteoarthritis who seen in consultation at the request of Dr. Chilton Si for evaluation of indigestion.  Also with mild fecal seepage.  1. GERD/heartburn -- the patient's symptoms have worsened recently, and given her age and failure to respond to an adequate trial of omeprazole, I am inclined to repeat her upper endoscopy. I will also start her on a more potent PPI with pantoprazole 40 mg daily. We discussed how best to take this, specifically 30 minutes to one hour before her first meal of the day. I also will give her a prescription for simethicone to be used as needed  for belching and gas. We discussed the risk and benefits of endoscopy and she is agreeable to proceed.  2. Fecal seepage -- the patient does have decreased rectal tone on rectal exam and this may be the reason for her fecal seepage, however given the abnormality on rectal exam, and her history of a large rectal polyp resected in 2005, I have recommended a flexible sigmoidoscopy at the time of her EGD. She is agreeable to this procedure. We also discussed the risks and benefits of this test.  Follow will be after procedures, to ensure she responds to medical therapy.

## 2011-11-03 NOTE — Patient Instructions (Signed)
You have been scheduled for a Endo/flex Please follow written instructions given to you at your visit today. Please pick up your prep at the pharmacy within the next 2-3 days.  We have sent the following medications to your pharmacy for you to pick up at your convenience: Protonix, Gas-X please take as directed

## 2011-11-25 ENCOUNTER — Encounter: Payer: Self-pay | Admitting: Internal Medicine

## 2011-11-25 ENCOUNTER — Ambulatory Visit (AMBULATORY_SURGERY_CENTER): Payer: Medicare Other | Admitting: Internal Medicine

## 2011-11-25 DIAGNOSIS — Z86010 Personal history of colonic polyps: Secondary | ICD-10-CM

## 2011-11-25 DIAGNOSIS — D129 Benign neoplasm of anus and anal canal: Secondary | ICD-10-CM

## 2011-11-25 DIAGNOSIS — R1013 Epigastric pain: Secondary | ICD-10-CM

## 2011-11-25 DIAGNOSIS — D128 Benign neoplasm of rectum: Secondary | ICD-10-CM

## 2011-11-25 DIAGNOSIS — R151 Fecal smearing: Secondary | ICD-10-CM

## 2011-11-25 DIAGNOSIS — K6289 Other specified diseases of anus and rectum: Secondary | ICD-10-CM

## 2011-11-25 DIAGNOSIS — K3189 Other diseases of stomach and duodenum: Secondary | ICD-10-CM

## 2011-11-25 DIAGNOSIS — D126 Benign neoplasm of colon, unspecified: Secondary | ICD-10-CM

## 2011-11-25 DIAGNOSIS — K219 Gastro-esophageal reflux disease without esophagitis: Secondary | ICD-10-CM

## 2011-11-25 MED ORDER — SODIUM CHLORIDE 0.9 % IV SOLN: 500.0000 mL | INTRAVENOUS | Status: DC

## 2011-11-25 MED ORDER — NYSTATIN-TRIAMCINOLONE 100000-0.1 UNIT/GM-% EX OINT: TOPICAL_OINTMENT | Freq: Two times a day (BID) | CUTANEOUS | Status: DC

## 2011-11-25 MED ORDER — RANITIDINE HCL 300 MG PO TABS: 300.0000 mg | ORAL_TABLET | Freq: Every day | ORAL | Status: DC

## 2011-11-25 NOTE — Op Note (Signed)
[facility] [address] [place], [place]  [address]  COLONOSCOPY PROCEDURE REPORT  PATIENT:  Heather, Parker  MR#:  [id number] BIRTHDATE:  01-15-1922, 89 yrs. old  GENDER:  female ENDOSCOPIST:  Carie Caddy. Renard Caperton, MD REF. BY:  Murray Hodgkins, M.D. PROCEDURE DATE:  11/25/2011 PROCEDURE:  Colonoscopy with biopsy and snare polypectomy ASA CLASS:  Class II INDICATIONS:  Abnormal rectal examination, fecal seepage, hx of adenomatous colon polyps MEDICATIONS:   MAC sedation, administered by CRNA  DESCRIPTION OF PROCEDURE:   After the risks benefits and alternatives of the procedure were thoroughly explained, informed consent was obtained.  Digital rectal exam was performed and revealed perianal dermatitis and rectal mass.   The [id number] endoscope was introduced through the anus and advanced to the cecum, which was identified by both the appendix and ileocecal valve, without limitations.  The quality of the prep was adequate, using Fleets Enemas.  The instrument was then slowly withdrawn as the colon was fully examined. <<PROCEDUREIMAGES>>  FINDINGS:  Flexible sigmoidoscopy was planned, however due to good preparation and the desire to exclude other possibly surgical lesions, the decision was made to attempt colonoscopy.  The prep was adequate in the right colon to exclude large polyps/masses. Mild diverticulosis was found transverse to sigmoid  A 5 mm sessile polyp was found in the distal transverse colon. Polyp was snared without cautery. Retrieval was successful.  A large and sessile polyp measuring approximately 2.5 cm was found in the rectum extending to the dentate line. Multiple biopsies were obtained and sent to pathology.   Retroflexed views in the rectum revealed no other findings other than those already described.  The scope was then withdrawn from the cecum and the procedure completed.  COMPLICATIONS:  None ENDOSCOPIC IMPRESSION: 1) Mild diverticulosis  in the transverse to sigmoid 2) Sessile polyp in the distal transverse colon. Removed and sent to pathology. 3) Large sessile polyp in the rectum.  Not felt endoscopically removable due to size, location very close to dentate line, and previous attempts at endoscopic resection.  RECOMMENDATIONS: 1) Await pathology results 2) High fiber diet. 3) Referral to general surgery for consideration of transanal resection of large polyp. 4) You will receive a letter within 1-2 weeks with the results of your biopsy as well as final recommendations. Please call my office if you have not received a letter after 3 weeks. 5) Topical nystatin ointment to perianal dermatitis (likely fungal)  Carie Caddy. Rhea Belton, MD  CC:  Murray Hodgkins, MD The Patient  n. eSIGNED:   Carie Caddy. Katera Rybka at 11/25/2011 10:28 AM  Burna Sis, [id number]

## 2011-11-25 NOTE — Op Note (Signed)
[facility] [address] [place], [place]  [address]  ENDOSCOPY PROCEDURE REPORT  PATIENT:  Heather Parker, Heather Parker  MR#:  [id number] BIRTHDATE:  [birth year], 89 yrs. old  GENDER:  female ENDOSCOPIST:  Carie Caddy. Kisean Rollo, MD Referred by:  Murray Hodgkins, M.D. PROCEDURE DATE:  11/25/2011 PROCEDURE:  EGD, diagnostic 43235 ASA CLASS:  Class II INDICATIONS:  GERD, dyspepsia MEDICATIONS:   MAC sedation, administered by CRNA TOPICAL ANESTHETIC:  none  DESCRIPTION OF PROCEDURE:   After the risks benefits and alternatives of the procedure were thoroughly explained, informed consent was obtained.  The LB [id number]-H180 [id number] endoscope was introduced through the mouth and advanced to the second portion of the duodenum, without limitations.  The instrument was slowly withdrawn as the mucosa was fully examined. <<PROCEDUREIMAGES>>  The esophagus and gastroesophageal junction were completely normal in appearance.  A small hiatal hernia was found.  Otherwise normal stomach.  The duodenal bulb was normal in appearance, as was the postbulbar duodenum.    Retroflexed views revealed a hiatal hernia.    The scope was then withdrawn from the patient and the procedure completed.  COMPLICATIONS:  None  ENDOSCOPIC IMPRESSION: 1) Normal esophagus 2) Hiatal hernia 3) Otherwise normal stomach 4) Normal duodenum  RECOMMENDATIONS: 1) Continue your pantoprazole 40 mg daily.  This is best taken 30 minutes to 1 hour before breakfast.  Also, add ranitidine 300 mg at bedtime to help control reflux symptoms. 2) Office follow-up in about 6 weeks to reassess symptoms.  Carie Caddy. Rhea Belton, MD  CC:  Murray Hodgkins, MD The Patient  n. eSIGNED:   Carie Caddy. Der Gagliano at 11/25/2011 10:18 AM  Burna Sis, [phone]

## 2011-11-25 NOTE — Patient Instructions (Addendum)
FOLLOW DISCHARGE INSTRUCTIONS (BLUE AND GREEN SHEETS).. Continue pantoprazole 40 mg. Daily Best to take 30 minutes before breakfast. Add Ranitidine 300 mg at bedtime for  Reflux sypmtoms.  Follow up in office in 6 weeks for recheck. High fiber diet.  Nystatin ointment around anus for dermatitis.

## 2011-11-25 NOTE — Progress Notes (Signed)
Patient did not experience any of the following events: a burn prior to discharge; a fall within the facility; wrong site/side/patient/procedure/implant event; or a hospital transfer or hospital admission upon discharge from the facility. (G8907) Patient with preoperative order for IV antibiotic SSI prophylaxis, antibiotic initiated on time. (G8916)  

## 2011-11-26 ENCOUNTER — Telehealth: Payer: Self-pay | Admitting: *Deleted

## 2011-11-26 NOTE — Telephone Encounter (Signed)
  Follow up Call-  Call back number 11/25/2011  Post procedure Call Back phone  # [phone]  Permission to leave phone message Yes     Patient questions:  Do you have a fever, pain , or abdominal swelling? no Pain Score  0 *  Have you tolerated food without any problems? yes  Have you been able to return to your normal activities? yes  Do you have any questions about your discharge instructions: Diet   no Medications  no Follow up visit  no  Do you have questions or concerns about your Care? no  Actions: * If pain score is 4 or above: No action needed, pain <4.

## 2011-11-28 ENCOUNTER — Telehealth: Payer: Self-pay | Admitting: *Deleted

## 2011-11-28 NOTE — Telephone Encounter (Signed)
Per EGD on 11/25/11, pt is to see Dr Rhea Belton  in 6 weeks. Mailed appt info for 12/29/11.

## 2011-12-01 ENCOUNTER — Encounter: Payer: Self-pay | Admitting: Internal Medicine

## 2011-12-01 ENCOUNTER — Telehealth: Payer: Self-pay | Admitting: Internal Medicine

## 2011-12-01 DIAGNOSIS — K620 Anal polyp: Secondary | ICD-10-CM

## 2011-12-01 NOTE — Telephone Encounter (Signed)
Notified pt Dr Rhea Belton stated it's ok to see Dr Newman Nip in mid March. We got her path back and there is no cancer. Pt stated understanding.

## 2011-12-01 NOTE — Telephone Encounter (Signed)
This is fine 

## 2011-12-01 NOTE — Telephone Encounter (Signed)
Patient is scheduled to see Dr. Avel Peace on 12/29/11 @ 1:30pm, arrive at 1:00pm. If you have any questions please call [phone] Informed pt of her appt with Dr Abbey Chatters. Pt is concerned it's too long to wait. Path is back; Tubulovillous Adenomas. Ok to wait for 12/29/11 appt? Thanks.    in

## 2011-12-01 NOTE — Telephone Encounter (Signed)
Pt reports she has spoken with several friends at [facility] who would like to see Dr Abbey Chatters; referral sent to [facility].

## 2011-12-03 DIAGNOSIS — K620 Anal polyp: Secondary | ICD-10-CM

## 2011-12-03 HISTORY — DX: Anal polyp: K62.0

## 2011-12-04 ENCOUNTER — Encounter: Payer: Self-pay | Admitting: *Deleted

## 2011-12-04 ENCOUNTER — Telehealth: Payer: Self-pay | Admitting: Internal Medicine

## 2011-12-04 NOTE — Telephone Encounter (Signed)
Pt called to report she changed her appt with Dr Rhea Belton- OK. She also wanted to verify her bx showed no cancer; informed her there was no dysplasia seen; pt stated understanding.

## 2011-12-10 ENCOUNTER — Telehealth: Payer: Self-pay | Admitting: Internal Medicine

## 2011-12-10 ENCOUNTER — Telehealth (INDEPENDENT_AMBULATORY_CARE_PROVIDER_SITE_OTHER): Payer: Self-pay

## 2011-12-10 NOTE — Telephone Encounter (Signed)
Pt reports increased rectal seepage and stomach cramping; she doesn't think she should have to wait until 12/29/11 to see Dr Abbey Chatters. Explained to pt she did request Dr Abbey Chatters, so that took longer. Also, I called [facility] yesterday and the earliest appt with any surgeon was after 12/29/11. Pt stated understanding and will f/u with Dr Rhea Belton on 12/19/11.

## 2011-12-10 NOTE — Telephone Encounter (Signed)
Pt called to ask if she could move her 12/29/11 appointment up.  She is having a lot of drainage, and is concerned about waiting.  I assured her that the 18th was not too long to wait, but if Dr. Abbey Chatters had any cancellations I would let her know.

## 2011-12-17 ENCOUNTER — Encounter: Payer: Self-pay | Admitting: Internal Medicine

## 2011-12-19 ENCOUNTER — Encounter: Payer: Self-pay | Admitting: Internal Medicine

## 2011-12-19 ENCOUNTER — Ambulatory Visit (INDEPENDENT_AMBULATORY_CARE_PROVIDER_SITE_OTHER): Payer: Medicare Other | Admitting: Internal Medicine

## 2011-12-19 VITALS — BP 120/90 | HR 76 | Ht 61.0 in | Wt 152.8 lb

## 2011-12-19 DIAGNOSIS — D128 Benign neoplasm of rectum: Secondary | ICD-10-CM

## 2011-12-19 DIAGNOSIS — D129 Benign neoplasm of anus and anal canal: Secondary | ICD-10-CM

## 2011-12-19 DIAGNOSIS — K219 Gastro-esophageal reflux disease without esophagitis: Secondary | ICD-10-CM

## 2011-12-19 MED ORDER — RANITIDINE HCL 300 MG PO TABS: 300.0000 mg | ORAL_TABLET | Freq: Two times a day (BID) | ORAL | Status: DC

## 2011-12-19 NOTE — Patient Instructions (Signed)
Discontinue taking protonix and continue taking Zantac twice daily.  Follow up with Dr. Rhea Belton in 6 wks

## 2011-12-19 NOTE — Progress Notes (Signed)
  Subjective:    Patient ID: Heather Parker, female    DOB: 02/05/1922, 78 y.o.   MRN: [id number]  HPI Mrs. 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 The patient underwent upper endoscopy last month which was unremarkable, and each bedtime ranitidine was added to her regimen of pantoprazole 40 mg daily.  She reports a very good response to the ranitidine at night, but less response to the ongoing pantoprazole in the daytime. She reports belching and dyspeptic symptoms along with heartburn throughout the day. These symptoms are not present if she doesn't eat. She denies vomiting. No dysphagia. No abdominal pain.  She continues to be very concerned about the polyp found in her distal rectum. He is waiting appointment with Dr. Abbey Chatters, she scheduled for 12/29/2011.  She has worried about this, and this has caused some psychological stress.  She does continue to have some fecal incontinence and fecal seepage. She saw a scant amount of red blood on one occasion last week.  Review of Systems As per history of present illness, otherwise negative  Current Medications, Allergies, Past Medical History, Past Surgical History, Family History and Social History were reviewed in [employer] record.     Objective:   Physical Exam BP 120/90  Pulse 76  Ht 5\' 1"  (1.549 m)  Wt 152 lb 12.8 oz (69.31 kg)  BMI 28.87 kg/m2 Constitutional: Well-developed and well-nourished. No distress. HEENT: Normocephalic and atraumatic. Oropharynx is clear and moist. No oropharyngeal exudate. Conjunctivae are normal. Pupils are equal round and reactive to light. No scleral icterus. Cardiovascular: Normal rate, regular rhythm and intact distal pulses. No M/R/G Pulmonary/chest: Effort normal and breath sounds normal. No wheezing, rales or rhonchi. Abdominal: Soft, nontender,  nondistended. Bowel sounds active throughout. There are no masses palpable. No hepatosplenomegaly. Extremities: no clubbing, cyanosis, or edema Neurological: Alert and oriented to person place and time. Skin: Skin is warm and dry. No rashes noted. Psychiatric: Normal mood and affect. Behavior is normal.      Assessment & Plan:  78 yo female with PMH of HTN, GERD, hemorrhoids, osteoarthritis who seen in followup for GERD and also the presence of a tubulovillous adenoma in the distal rectum.  1. GERD -- EGD was performed and reassuring. She is having a better response to ranitidine than to pantoprazole. Therefore, I decided to discontinue pantoprazole and add ranitidine in the morning as well. She will be on ranitidine 300 mg twice a day. I'll see her back to gauge her response to this change in therapy  2. Rectal tubulovillous adenoma -- the rectal polyp is very distal and approaches the dentate line. This, combined with the fact that this has been endoscopically resected previously, makes the chances for complete endoscopic resection very low and technically very difficult. I therefore have referred her to general surgery for consideration of transanal resection.  I feel this gives her the best chance at complete resection and also with likely help with her fecal seepage issue. She does have an appointment coming up on 12/29/2011. We called the general surgery office today and Dr. Abbey Chatters or cannot see her earlier due to his extremely busy schedule.

## 2011-12-29 ENCOUNTER — Ambulatory Visit (INDEPENDENT_AMBULATORY_CARE_PROVIDER_SITE_OTHER): Payer: Medicare Other | Admitting: General Surgery

## 2011-12-29 ENCOUNTER — Ambulatory Visit: Payer: Medicare Other | Admitting: Internal Medicine

## 2011-12-29 ENCOUNTER — Encounter (INDEPENDENT_AMBULATORY_CARE_PROVIDER_SITE_OTHER): Payer: Self-pay | Admitting: General Surgery

## 2011-12-29 VITALS — BP 142/80 | HR 72 | Temp 96.5°F | Resp 14 | Ht 61.5 in | Wt 152.0 lb

## 2011-12-29 DIAGNOSIS — D128 Benign neoplasm of rectum: Secondary | ICD-10-CM

## 2011-12-29 DIAGNOSIS — K621 Rectal polyp: Secondary | ICD-10-CM

## 2011-12-29 NOTE — Progress Notes (Signed)
Patient ID: Heather Parker, female   DOB: 02/21/1922, 78 y.o.   MRN: [id number]  No chief complaint on file.   HPI Heather Parker is a 78 y.o. female.   HPI  She is referred by Dr. Rhea Belton for further evaluation and treatment of a sessile distal rectal mass/polyp. She has had some problems with mild incontinence and leakage from the rectal area. She has a history of a rectal polyp being removed previously. She underwent a colonoscopy and was found to have some polyps but one in the distal rectum it was approximately 2.5 cm from the anal verge and could not be removed completely by way of colonoscopy. It is sessile in appearance. It is a benign adenomatous polyp on biopsy. She's been sent here to discuss transanal excision.  Past Medical History  Diagnosis Date  . Unspecified essential hypertension   . Regional enteritis of unspecified site   . Hemorrhoids   . GERD (gastroesophageal reflux disease)   . Ruptured lumbar disc   . Adenomatous rectal polyp     Past Surgical History  Procedure Date  . Appendectomy   . Vaginal hysterectomy     still has ovaries  . Knee arthroscopy     Bi-lat  . Tonsillectomy and adenoidectomy   . Colonoscopy   . Polypectomy     Family History  Problem Relation Age of Onset  . Stomach cancer Sister   . Lung cancer Brother   . Heart disease Father   . Stroke Father   . Colon cancer Neg Hx     Social History History  Substance Use Topics  . Smoking status: Never Smoker   . Smokeless tobacco: Never Used  . Alcohol Use: No    Allergies  Allergen Reactions  . Codeine Nausea And Vomiting  . Darvocet (Propoxyphene N-Acetaminophen) Other (See Comments)    Caused her to pass out  . Darvon Other (See Comments)    Pt unsure of reaction    Current Outpatient Prescriptions  Medication Sig Dispense Refill  . Biotin 5000 MCG CAPS Take 1 capsule by mouth. 1 tablet 15th of month, 1 tablet last day of the month.      . ergocalciferol (VITAMIN D2) 50000  UNITS capsule Take 50,000 Units by mouth every 21 ( twenty-one) days.       . meclizine (ANTIVERT) 25 MG tablet Take 25 mg by mouth.      . Multiple Vitamins-Minerals (CENTRUM PO) Take 1 tablet by mouth daily.      . naproxen sodium (ANAPROX) 220 MG tablet Take 220 mg by mouth 2 (two) times daily with a meal.      . NIFEdipine (PROCARDIA-XL/ADALAT CC) 30 MG 24 hr tablet Take 30 mg by mouth daily.      Marland Kitchen nystatin-triamcinolone ointment (MYCOLOG) Apply topically 2 (two) times daily. To perianal skin redness/rash  60 g  0  . ranitidine (ZANTAC) 300 MG tablet Take 1 tablet (300 mg total) by mouth 2 (two) times daily.  30 tablet  5  . nebivolol (BYSTOLIC) 10 MG tablet Take 10 mg by mouth daily.        Review of Systems Review of Systems  Respiratory: Negative.   Cardiovascular: Negative.   Gastrointestinal: Positive for anal bleeding. Negative for rectal pain.  Neurological: Negative for seizures and weakness.  Hematological: Does not bruise/bleed easily.  Psychiatric/Behavioral: The patient is nervous/anxious.     Blood pressure 142/80, pulse 72, temperature 96.5 F (35.8 C), temperature source Temporal,  resp. rate 14, height 5' 1.5" (1.562 m), weight 152 lb (68.947 kg).  Physical Exam Physical Exam  Constitutional:       Elderly female who is slightly anxious. Very pleasant and cooperative.  HENT:  Head: Normocephalic and atraumatic.  Neck: Neck supple.  Cardiovascular: Normal rate and regular rhythm.   Pulmonary/Chest: Effort normal and breath sounds normal.  Abdominal: Soft. She exhibits no distension and no mass. There is no tenderness.  Genitourinary:       Anorectal-external skin tags are present; no fissures are noted; the sphincter tone is decreased; there is a palpable mass approximately 2.5 cm from the anal verge at the 3:00 position. No blood is present.  Anoscopy-sessile/polypoid mass seen at the 3 clock position.  Lymphadenopathy:    She has no cervical adenopathy.     Data Reviewed Dr. Lauro Franklin notes and colonoscopy report.  Assessment    Distal rectal sessile polyp that that cannot be removed by way of colonoscopy. Also has some problems with mild fecal incontinence. She may be having mucousy drainage because of this polyp    Plan    Transanal excision of rectal polyp. The procedure and risks were discussed with her. The risks include but are not limited to bleeding, infection, recurrence, anesthesia. I also told her that she may continue to have problems with incontinence and leakage. She seems to understand this and would like to proceed.       Alisi Lupien J 12/29/2011, 6:12 PM

## 2011-12-29 NOTE — Patient Instructions (Signed)
Due to the Kegel exercises as instructed.

## 2012-01-02 ENCOUNTER — Encounter (HOSPITAL_COMMUNITY): Payer: Self-pay | Admitting: Pharmacy Technician

## 2012-01-06 ENCOUNTER — Encounter (HOSPITAL_COMMUNITY): Payer: Self-pay

## 2012-01-06 ENCOUNTER — Ambulatory Visit (HOSPITAL_COMMUNITY)
Admission: RE | Admit: 2012-01-06 | Discharge: 2012-01-06 | Disposition: A | Discharge status: Home / Self Care | Payer: Medicare Other | Source: Ambulatory Visit | Attending: General Surgery | Admitting: General Surgery

## 2012-01-06 ENCOUNTER — Encounter (HOSPITAL_COMMUNITY)
Admission: RE | Admit: 2012-01-06 | Discharge: 2012-01-06 | Disposition: A | Discharge status: Home / Self Care | Payer: Medicare Other | Source: Ambulatory Visit | Attending: General Surgery | Admitting: General Surgery

## 2012-01-06 DIAGNOSIS — M47814 Spondylosis without myelopathy or radiculopathy, thoracic region: Secondary | ICD-10-CM | POA: Insufficient documentation

## 2012-01-06 DIAGNOSIS — I10 Essential (primary) hypertension: Secondary | ICD-10-CM | POA: Insufficient documentation

## 2012-01-06 DIAGNOSIS — Z01812 Encounter for preprocedural laboratory examination: Secondary | ICD-10-CM | POA: Insufficient documentation

## 2012-01-06 DIAGNOSIS — Z01818 Encounter for other preprocedural examination: Secondary | ICD-10-CM | POA: Insufficient documentation

## 2012-01-06 HISTORY — DX: Unspecified osteoarthritis, unspecified site: M19.90

## 2012-01-06 HISTORY — DX: Chronic kidney disease, unspecified: N18.9

## 2012-01-06 LAB — PROTIME-INR
INR: 1.09 (ref 0.00–1.49)
Prothrombin Time: 14.3 seconds (ref 11.6–15.2)

## 2012-01-06 LAB — COMPREHENSIVE METABOLIC PANEL
Albumin: 3.7 g/dL (ref 3.5–5.2)
BUN: 22 mg/dL (ref 6–23)
Calcium: 9.5 mg/dL (ref 8.4–10.5)
Creatinine, Ser: 1.02 mg/dL (ref 0.50–1.10)
Total Protein: 6.7 g/dL (ref 6.0–8.3)

## 2012-01-06 LAB — SURGICAL PCR SCREEN: Staphylococcus aureus: NEGATIVE

## 2012-01-06 LAB — CBC
HCT: 40 % (ref 36.0–46.0)
MCHC: 33.3 g/dL (ref 30.0–36.0)
MCV: 88.3 fL (ref 78.0–100.0)
RDW: 12.6 % (ref 11.5–15.5)

## 2012-01-06 NOTE — Pre-Procedure Instructions (Signed)
01/06/12 Called and spoke with Edwyna Shell at office regarding time of fleets enema .  Order was given to do morning of surgery per [facility].  Pt given written and verbal instructions regarding this.

## 2012-01-06 NOTE — Patient Instructions (Signed)
20 SAMIYAH STUPKA  01/06/2012   Your procedure is scheduled on:  01/19/12 [id number]  Report to [facility] at 0700 AM.  Call this number if you have problems the morning of surgery: [phone]   Remember: Fleets enema    Do not eat food:After Midnight.  May have clear liquids:until Midnight .    Take these medicines the morning of surgery with A SIP OF WATER:   Do not wear jewelry, make-up or nail polish.  Do not wear lotions, powders, or perfumes. .  Do not shave 48 hours prior to surgery.  Do not bring valuables to the hospital.  Contacts, dentures or bridgework may not be worn into surgery.  Leave suitcase in the car. After surgery it may be brought to your room.  For patients admitted to the hospital, checkout time is 11:00 AM the day of discharge.      Special Instructions: CHG Shower Use Special Wash: 1/2 bottle night before surgery and 1/2 bottle morning of surgery. shower chin to toes with CHG.  Wash face and private parts with regular soap.    Please read over the following fact sheets that you were given: MRSA Information, coughing and deep breathing exercises, leg exercises

## 2012-01-12 DIAGNOSIS — L578 Other skin changes due to chronic exposure to nonionizing radiation: Secondary | ICD-10-CM

## 2012-01-12 HISTORY — DX: Other skin changes due to chronic exposure to nonionizing radiation: L57.8

## 2012-01-18 MED ORDER — DEXTROSE 5 % IV SOLN
1.0000 g | INTRAVENOUS | Status: AC
  Administered 2012-01-19: 1 g via INTRAVENOUS
  Filled 2012-01-18: qty 1

## 2012-01-19 ENCOUNTER — Encounter (HOSPITAL_COMMUNITY)
Admission: RE | Disposition: A | Discharge status: Home / Self Care | Payer: Self-pay | Source: Ambulatory Visit | Attending: General Surgery

## 2012-01-19 ENCOUNTER — Encounter (HOSPITAL_COMMUNITY): Payer: Self-pay | Admitting: Anesthesiology

## 2012-01-19 ENCOUNTER — Ambulatory Visit (HOSPITAL_COMMUNITY)
Admission: RE | Admit: 2012-01-19 | Discharge: 2012-01-20 | Disposition: A | Discharge status: Home / Self Care | Payer: Medicare Other | Source: Ambulatory Visit | Attending: General Surgery | Admitting: General Surgery

## 2012-01-19 ENCOUNTER — Ambulatory Visit (HOSPITAL_COMMUNITY): Payer: Medicare Other | Admitting: Anesthesiology

## 2012-01-19 ENCOUNTER — Encounter (HOSPITAL_COMMUNITY): Payer: Self-pay

## 2012-01-19 DIAGNOSIS — I10 Essential (primary) hypertension: Secondary | ICD-10-CM | POA: Insufficient documentation

## 2012-01-19 DIAGNOSIS — D128 Benign neoplasm of rectum: Secondary | ICD-10-CM | POA: Insufficient documentation

## 2012-01-19 DIAGNOSIS — Z01812 Encounter for preprocedural laboratory examination: Secondary | ICD-10-CM | POA: Insufficient documentation

## 2012-01-19 DIAGNOSIS — K621 Rectal polyp: Secondary | ICD-10-CM

## 2012-01-19 DIAGNOSIS — D129 Benign neoplasm of anus and anal canal: Secondary | ICD-10-CM | POA: Insufficient documentation

## 2012-01-19 DIAGNOSIS — K620 Anal polyp: Secondary | ICD-10-CM

## 2012-01-19 DIAGNOSIS — D371 Neoplasm of uncertain behavior of stomach: Secondary | ICD-10-CM

## 2012-01-19 DIAGNOSIS — D378 Neoplasm of uncertain behavior of other specified digestive organs: Secondary | ICD-10-CM

## 2012-01-19 DIAGNOSIS — D375 Neoplasm of uncertain behavior of rectum: Secondary | ICD-10-CM

## 2012-01-19 DIAGNOSIS — Z8719 Personal history of other diseases of the digestive system: Secondary | ICD-10-CM | POA: Insufficient documentation

## 2012-01-19 HISTORY — DX: Other skin changes due to chronic exposure to nonionizing radiation: L57.8

## 2012-01-19 LAB — ABO/RH: ABO/RH(D): B NEG

## 2012-01-19 LAB — TYPE AND SCREEN: ABO/RH(D): B NEG

## 2012-01-19 SURGERY — EXCISION, MASS, RECTUM, ANAL APPROACH
Anesthesia: General | Site: Anus | Wound class: Clean Contaminated

## 2012-01-19 MED ORDER — METOCLOPRAMIDE HCL 5 MG/ML IJ SOLN
INTRAMUSCULAR | Status: DC | PRN
  Administered 2012-01-19: 10 mg via INTRAVENOUS

## 2012-01-19 MED ORDER — ONDANSETRON HCL 4 MG PO TABS: 4.0000 mg | ORAL_TABLET | Freq: Four times a day (QID) | ORAL | Status: DC | PRN

## 2012-01-19 MED ORDER — LACTATED RINGERS IV SOLN
INTRAVENOUS | Status: DC
  Administered 2012-01-19 (×2): 1000 mL via INTRAVENOUS

## 2012-01-19 MED ORDER — ACETAMINOPHEN 10 MG/ML IV SOLN
INTRAVENOUS | Status: AC
  Filled 2012-01-19: qty 100

## 2012-01-19 MED ORDER — ONDANSETRON HCL 4 MG/2ML IJ SOLN
INTRAMUSCULAR | Status: DC | PRN
  Administered 2012-01-19: 4 mg via INTRAVENOUS

## 2012-01-19 MED ORDER — PROPOFOL 10 MG/ML IV BOLUS
INTRAVENOUS | Status: DC | PRN
Start: 2012-01-19 — End: 2012-01-19
  Administered 2012-01-19: 150 mg via INTRAVENOUS

## 2012-01-19 MED ORDER — FENTANYL CITRATE 0.05 MG/ML IJ SOLN
INTRAMUSCULAR | Status: DC | PRN
  Administered 2012-01-19: 50 ug via INTRAVENOUS
  Administered 2012-01-19: 25 ug via INTRAVENOUS

## 2012-01-19 MED ORDER — EPHEDRINE SULFATE 50 MG/ML IJ SOLN
INTRAMUSCULAR | Status: DC | PRN
  Administered 2012-01-19: 7.5 mg via INTRAVENOUS

## 2012-01-19 MED ORDER — CEFOXITIN SODIUM-DEXTROSE 1-4 GM-% IV SOLR (PREMIX)
INTRAVENOUS | Status: AC
  Filled 2012-01-19: qty 50

## 2012-01-19 MED ORDER — FLEET ENEMA 7-19 GM/118ML RE ENEM: 1.0000 | ENEMA | Freq: Once | RECTAL | Status: DC

## 2012-01-19 MED ORDER — MECLIZINE HCL 25 MG PO TABS
25.0000 mg | ORAL_TABLET | Freq: Three times a day (TID) | ORAL | Status: DC | PRN
  Filled 2012-01-19: qty 1

## 2012-01-19 MED ORDER — ACETAMINOPHEN 10 MG/ML IV SOLN
INTRAVENOUS | Status: DC | PRN
  Administered 2012-01-19: 1000 mg via INTRAVENOUS

## 2012-01-19 MED ORDER — 0.9 % SODIUM CHLORIDE (POUR BTL) OPTIME
TOPICAL | Status: DC | PRN
  Administered 2012-01-19: 1000 mL

## 2012-01-19 MED ORDER — HYDROMORPHONE HCL PF 1 MG/ML IJ SOLN: 0.2500 mg | INTRAMUSCULAR | Status: DC | PRN

## 2012-01-19 MED ORDER — ACETAMINOPHEN 325 MG PO TABS: 650.0000 mg | ORAL_TABLET | ORAL | Status: DC | PRN

## 2012-01-19 MED ORDER — DEXTROSE-NACL 5-0.9 % IV SOLN
INTRAVENOUS | Status: DC
  Administered 2012-01-19: 17:00:00 via INTRAVENOUS
  Administered 2012-01-20: 75 mL via INTRAVENOUS

## 2012-01-19 MED ORDER — NEBIVOLOL HCL 10 MG PO TABS
10.0000 mg | ORAL_TABLET | Freq: Every day | ORAL | Status: DC
  Filled 2012-01-19: qty 1

## 2012-01-19 MED ORDER — DOCUSATE SODIUM 100 MG PO CAPS
100.0000 mg | ORAL_CAPSULE | Freq: Two times a day (BID) | ORAL | Status: DC
  Administered 2012-01-19: 100 mg via ORAL
  Filled 2012-01-19 (×3): qty 1

## 2012-01-19 MED ORDER — BUPIVACAINE LIPOSOME 1.3 % IJ SUSP
20.0000 mL | Freq: Once | INTRAMUSCULAR | Status: AC
  Administered 2012-01-19: 20 mL
  Filled 2012-01-19: qty 20

## 2012-01-19 MED ORDER — NIFEDIPINE ER 30 MG PO TB24
30.0000 mg | ORAL_TABLET | Freq: Every day | ORAL | Status: DC
Start: 2012-01-20 — End: 2012-01-20
  Filled 2012-01-19: qty 1

## 2012-01-19 MED ORDER — LIDOCAINE HCL 1 % IJ SOLN
INTRAMUSCULAR | Status: DC | PRN
  Administered 2012-01-19: 50 mg via INTRADERMAL

## 2012-01-19 MED ORDER — ONDANSETRON HCL 4 MG/2ML IJ SOLN: 4.0000 mg | Freq: Four times a day (QID) | INTRAMUSCULAR | Status: DC | PRN

## 2012-01-19 SURGICAL SUPPLY — 36 items
BLADE HEX COATED 2.75 (ELECTRODE) ×2 IMPLANT
BLADE SURG 15 STRL LF DISP TIS (BLADE) ×1 IMPLANT
BLADE SURG 15 STRL SS (BLADE) ×1
BRIEF STRETCH FOR OB PAD LRG (UNDERPADS AND DIAPERS) IMPLANT
CANISTER SUCTION 2500CC (MISCELLANEOUS) ×2 IMPLANT
CLOTH BEACON ORANGE TIMEOUT ST (SAFETY) ×2 IMPLANT
COVER MAYO STAND STRL (DRAPES) IMPLANT
DECANTER SPIKE VIAL GLASS SM (MISCELLANEOUS) ×2 IMPLANT
DRAPE LG THREE QUARTER DISP (DRAPES) ×2 IMPLANT
DRSG PAD ABDOMINAL 8X10 ST (GAUZE/BANDAGES/DRESSINGS) IMPLANT
ELECT REM PT RETURN 9FT ADLT (ELECTROSURGICAL) ×2
ELECTRODE REM PT RTRN 9FT ADLT (ELECTROSURGICAL) ×1 IMPLANT
GAUZE SPONGE 4X4 16PLY XRAY LF (GAUZE/BANDAGES/DRESSINGS) ×2 IMPLANT
GLOVE BIOGEL PI IND STRL 7.0 (GLOVE) ×1 IMPLANT
GLOVE BIOGEL PI INDICATOR 7.0 (GLOVE) ×1
GOWN STRL NON-REIN LRG LVL3 (GOWN DISPOSABLE) ×2 IMPLANT
GOWN STRL REIN XL XLG (GOWN DISPOSABLE) ×4 IMPLANT
HEMOSTAT SNOW SURGICEL 2X4 (HEMOSTASIS) IMPLANT
KIT BASIN OR (CUSTOM PROCEDURE TRAY) ×2 IMPLANT
LUBRICANT JELLY K Y 4OZ (MISCELLANEOUS) ×2 IMPLANT
NDL SAFETY ECLIPSE 18X1.5 (NEEDLE) IMPLANT
NEEDLE HYPO 18GX1.5 SHARP (NEEDLE)
NEEDLE HYPO 25X1 1.5 SAFETY (NEEDLE) ×2 IMPLANT
NS IRRIG 1000ML POUR BTL (IV SOLUTION) ×2 IMPLANT
PACK LITHOTOMY IV (CUSTOM PROCEDURE TRAY) ×2 IMPLANT
PAD ABD 7.5X8 STRL (GAUZE/BANDAGES/DRESSINGS) ×2 IMPLANT
PENCIL BUTTON HOLSTER BLD 10FT (ELECTRODE) ×2 IMPLANT
SCISSORS HARMONIC WAVE 18CM (INSTRUMENTS) ×2 IMPLANT
SPONGE GAUZE 4X4 12PLY (GAUZE/BANDAGES/DRESSINGS) IMPLANT
SPONGE SURGIFOAM ABS GEL 100 (HEMOSTASIS) ×2 IMPLANT
SUT CHROMIC 2 0 SH (SUTURE) IMPLANT
SUT CHROMIC 3 0 SH 27 (SUTURE) IMPLANT
SUT VIC AB 2-0 SH 18 (SUTURE) ×2 IMPLANT
SYR CONTROL 10ML LL (SYRINGE) ×2 IMPLANT
TOWEL OR 17X26 10 PK STRL BLUE (TOWEL DISPOSABLE) ×2 IMPLANT
YANKAUER SUCT BULB TIP 10FT TU (MISCELLANEOUS) ×2 IMPLANT

## 2012-01-19 NOTE — Op Note (Signed)
Operative Note  Heather Parker female 78 y.o. 01/19/2012  PREOPERATIVE DX:  Large sessile adenomatous polyp of the distal rectum left lateral position  POSTOPERATIVE DX:  Same and small polyp posteriorly distal rectum  PROCEDURE:  Transanal excision of sessile adenomatous polyp of the distal left rectum and posterior rectal polyp.        Surgeon: Adolph Pollack   Assistants: Arsenio Loader PA-S   Anesthesia: General LMA anesthesia  Indications:   This is an 78 year old female who is having problems with some mucousy drainage and intermittent bleeding. She underwent a colonoscopy which demonstrated a fairly large distal rectal polypoid mass. It was a sessile adenomatous polyp on pathology. She now presents for transanal excision. The procedure, risks, and aftercare been discussed with her.    Procedure Detail:  She was brought to the operating room placed supine on the operating table and given a general anesthetic. She was then placed in the lithotomy position. The perianal area was sterilely prepped and draped.  The left lateral rectal polyp could be seen extruding from the anus. It was grasped and pulled out of the anus. Using harmonic scalpel it was removed. It was sent to pathology. Bleeding from the polypectomy site was controlled with electrocautery.  Anoscopy was performed and there was a small flat polyp in the distal rectum which was removed with the harmonic scalpel. This was in the posterior position. This was sent to pathology as a separate specimen.  Exparel was injected into the subcutaneous tissues around the polypectomy sites. Inspection of these areas demonstrated no evidence of bleeding. Gelfoam was then placed into the rectum and a bulky dressing was applied.  She tolerated the procedures well without any apparent complications and was taken to the recovery room in satisfactory condition.   Estimated Blood Loss:  Minimal         Drains: none           Blood Given: none          Specimens: Left lateral rectal polyp and posterior rectal polyp        Complications:  * No complications entered in OR log *         Disposition: PACU - hemodynamically stable.         Condition: stable

## 2012-01-19 NOTE — H&P (View-Only) (Signed)
Patient ID: Heather Parker, female   DOB: Jul 17, 1922, 78 y.o.   MRN: [id number]  No chief complaint on file.   HPI Heather Parker is a 78 y.o. female.   HPI  She is referred by Dr. Rhea Belton for further evaluation and treatment of a sessile distal rectal mass/polyp. She has had some problems with mild incontinence and leakage from the rectal area. She has a history of a rectal polyp being removed previously. She underwent a colonoscopy and was found to have some polyps but one in the distal rectum it was approximately 2.5 cm from the anal verge and could not be removed completely by way of colonoscopy. It is sessile in appearance. It is a benign adenomatous polyp on biopsy. She's been sent here to discuss transanal excision.  Past Medical History  Diagnosis Date  . Unspecified essential hypertension   . Regional enteritis of unspecified site   . Hemorrhoids   . GERD (gastroesophageal reflux disease)   . Ruptured lumbar disc   . Adenomatous rectal polyp     Past Surgical History  Procedure Date  . Appendectomy   . Vaginal hysterectomy     still has ovaries  . Knee arthroscopy     Bi-lat  . Tonsillectomy and adenoidectomy   . Colonoscopy   . Polypectomy     Family History  Problem Relation Age of Onset  . Stomach cancer Sister   . Lung cancer Brother   . Heart disease Father   . Stroke Father   . Colon cancer Neg Hx     Social History History  Substance Use Topics  . Smoking status: Never Smoker   . Smokeless tobacco: Never Used  . Alcohol Use: No    Allergies  Allergen Reactions  . Codeine Nausea And Vomiting  . Darvocet (Propoxyphene N-Acetaminophen) Other (See Comments)    Caused her to pass out  . Darvon Other (See Comments)    Pt unsure of reaction    Current Outpatient Prescriptions  Medication Sig Dispense Refill  . Biotin 5000 MCG CAPS Take 1 capsule by mouth. 1 tablet 15th of month, 1 tablet last day of the month.      . ergocalciferol (VITAMIN D2) 50000  UNITS capsule Take 50,000 Units by mouth every 21 ( twenty-one) days.       . meclizine (ANTIVERT) 25 MG tablet Take 25 mg by mouth.      . Multiple Vitamins-Minerals (CENTRUM PO) Take 1 tablet by mouth daily.      . naproxen sodium (ANAPROX) 220 MG tablet Take 220 mg by mouth 2 (two) times daily with a meal.      . NIFEdipine (PROCARDIA-XL/ADALAT CC) 30 MG 24 hr tablet Take 30 mg by mouth daily.      Marland Kitchen nystatin-triamcinolone ointment (MYCOLOG) Apply topically 2 (two) times daily. To perianal skin redness/rash  60 g  0  . ranitidine (ZANTAC) 300 MG tablet Take 1 tablet (300 mg total) by mouth 2 (two) times daily.  30 tablet  5  . nebivolol (BYSTOLIC) 10 MG tablet Take 10 mg by mouth daily.        Review of Systems Review of Systems  Respiratory: Negative.   Cardiovascular: Negative.   Gastrointestinal: Positive for anal bleeding. Negative for rectal pain.  Neurological: Negative for seizures and weakness.  Hematological: Does not bruise/bleed easily.  Psychiatric/Behavioral: The patient is nervous/anxious.     Blood pressure 142/80, pulse 72, temperature 96.5 F (35.8 C), temperature source Temporal,  resp. rate 14, height 5' 1.5" (1.562 m), weight 152 lb (68.947 kg).  Physical Exam Physical Exam  Constitutional:       Elderly female who is slightly anxious. Very pleasant and cooperative.  HENT:  Head: Normocephalic and atraumatic.  Neck: Neck supple.  Cardiovascular: Normal rate and regular rhythm.   Pulmonary/Chest: Effort normal and breath sounds normal.  Abdominal: Soft. She exhibits no distension and no mass. There is no tenderness.  Genitourinary:       Anorectal-external skin tags are present; no fissures are noted; the sphincter tone is decreased; there is a palpable mass approximately 2.5 cm from the anal verge at the 3:00 position. No blood is present.  Anoscopy-sessile/polypoid mass seen at the 3 clock position.  Lymphadenopathy:    She has no cervical adenopathy.     Data Reviewed Dr. Lauro Franklin notes and colonoscopy report.  Assessment    Distal rectal sessile polyp that that cannot be removed by way of colonoscopy. Also has some problems with mild fecal incontinence. She may be having mucousy drainage because of this polyp    Plan    Transanal excision of rectal polyp. The procedure and risks were discussed with her. The risks include but are not limited to bleeding, infection, recurrence, anesthesia. I also told her that she may continue to have problems with incontinence and leakage. She seems to understand this and would like to proceed.       [name] 12/29/2011, 6:12 PM

## 2012-01-19 NOTE — Interval H&P Note (Signed)
History and Physical Interval Note:  01/19/2012 9:25 AM  Heather Parker  has presented today for surgery, with the diagnosis of recurrent rectal adenomatous polyp  The various methods of treatment have been discussed with the patient and family. After consideration of risks, benefits and other options for treatment, the patient has consented to  Procedure(s) (LRB): TRANSANAL EXCISION OF RECTAL MASS (N/A) as a surgical intervention .  The patients' history has been reviewed, patient examined, no change in status, stable for surgery.  I have reviewed the patients' chart and labs.  Questions were answered to the patient's satisfaction.     [name] [name]

## 2012-01-19 NOTE — Anesthesia Postprocedure Evaluation (Signed)
  Anesthesia Post-op Note  Patient: Heather Parker  Procedure(s) Performed: Procedure(s) (LRB): TRANSANAL EXCISION OF RECTAL MASS (N/A)  Patient Location: PACU  Anesthesia Type: General  Level of Consciousness: oriented and sedated  Airway and Oxygen Therapy: Patient Spontanous Breathing and Patient connected to nasal cannula oxygen  Post-op Pain: mild  Post-op Assessment: Post-op Vital signs reviewed, Patient's Cardiovascular Status Stable, Respiratory Function Stable and Patent Airway  Post-op Vital Signs: stable  Complications: No apparent anesthesia complications

## 2012-01-19 NOTE — Anesthesia Preprocedure Evaluation (Signed)
Anesthesia Evaluation  Patient identified by MRN, date of birth, ID band Patient awake  General Assessment Comment:Advanced yrs  Reviewed: Allergy & Precautions, H&P , NPO status , Patient's Chart, lab work & pertinent test results, reviewed documented beta blocker date and time   Airway Mallampati: II TM Distance: >3 FB Neck ROM: Full    Dental  (+) Teeth Intact and Dental Advisory Given   Pulmonary neg pulmonary ROS,  breath sounds clear to auscultation        Cardiovascular hypertension, Pt. on medications Rhythm:Regular Rate:Normal  Denies cardiac symptoms   Neuro/Psych negative neurological ROS  negative psych ROS   GI/Hepatic Neg liver ROS, Rectal polyp   Endo/Other  negative endocrine ROS  Renal/GU negative Renal ROS  negative genitourinary   Musculoskeletal negative musculoskeletal ROS (+)   Abdominal   Peds negative pediatric ROS (+)  Hematology negative hematology ROS (+)   Anesthesia Other Findings   Reproductive/Obstetrics negative OB ROS                           Anesthesia Physical Anesthesia Plan  ASA: III  Anesthesia Plan: General   Post-op Pain Management:    Induction: Intravenous  Airway Management Planned: LMA  Additional Equipment:   Intra-op Plan:   Post-operative Plan: Extubation in OR  Informed Consent: I have reviewed the patients History and Physical, chart, labs and discussed the procedure including the risks, benefits and alternatives for the proposed anesthesia with the patient or authorized representative who has indicated his/her understanding and acceptance.   Dental advisory given  Plan Discussed with: CRNA and Surgeon  Anesthesia Plan Comments:         Anesthesia Quick Evaluation

## 2012-01-19 NOTE — Transfer of Care (Signed)
Immediate Anesthesia Transfer of Care Note  Patient: Heather Parker  Procedure(s) Performed: Procedure(s) (LRB): TRANSANAL EXCISION OF RECTAL MASS (N/A)  Patient Location: PACU  Anesthesia Type: General  Level of Consciousness: awake, alert  and oriented  Airway & Oxygen Therapy: Patient Spontanous Breathing and Patient connected to face mask oxygen  Post-op Assessment: Report given to PACU RN, Post -op Vital signs reviewed and stable and Patient moving all extremities  Post vital signs: Reviewed and stable  Complications: No apparent anesthesia complications

## 2012-01-20 NOTE — Discharge Summary (Signed)
Physician Discharge Summary  Patient ID: Heather Parker MRN: [id number] DOB/AGE: 1922/07/21 78 y.o.  Admit date: 01/19/2012 Discharge date: 01/20/2012  Admission Diagnoses:  Rectal polyps  Discharge Diagnoses:  Rectal polyps   Discharged Condition: good  Hospital Course: She underwent transanal excision of the polyps on 01/19/12 and did well.  She was given discharge instructions and was discharged on 01/20/12.  Consults: None  Significant Diagnostic Studies: none  Treatments: surgery:   Transanal excision of rectal polyps.  Discharge Exam: Blood pressure 135/80, pulse 70, temperature 97.8 F (36.6 C), temperature source Oral, resp. rate 18, height 5\' 1"  (1.549 m), weight 150 lb (68.04 kg), SpO2 95.00%.   Disposition: 01-Home or Self Care  Discharge Orders    Future Appointments: Provider: Department: Dept Phone: Center:   01/30/2012 10:30 AM Beverley Fiedler, MD [facility] [place] Office [phone] [facility]   02/10/2012 9:10 AM Adolph Pollack, MD [facility] [place] [phone] None     Medication List  As of 01/20/2012  7:54 AM   TAKE these medications         Biotin 5000 MCG Caps   Take 1 capsule by mouth daily.      CENTRUM PO   Take 1 tablet by mouth every morning.      ergocalciferol 50000 UNITS capsule   Commonly known as: VITAMIN D2   Take 50,000 Units by mouth every 14 (fourteen) days.      meclizine 25 MG tablet   Commonly known as: ANTIVERT   Take 25 mg by mouth 3 (three) times daily as needed. For dizziness        naproxen sodium 220 MG tablet   Commonly known as: ANAPROX   Take 220 mg by mouth 2 (two) times daily with a meal.      nebivolol 10 MG tablet   Commonly known as: BYSTOLIC   Take 10 mg by mouth every morning.      NIFEdipine 30 MG 24 hr tablet   Commonly known as: PROCARDIA-XL/ADALAT CC   Take 30 mg by mouth every morning.      ranitidine 300 MG tablet   Commonly known as: ZANTAC   Take 300 mg by mouth daily. Only takes if needed            Tylenol for pain.  Stool softener.   Signed: Adolph Pollack 01/20/2012, 7:54 AM

## 2012-01-20 NOTE — Discharge Instructions (Signed)
[facility] _______[facility]  RECTAL SURGERY POST OP INSTRUCTIONS: POST OP INSTRUCTIONS  Always review your discharge instruction sheet given to you by the facility where your surgery was performed. IF YOU HAVE DISABILITY OR FAMILY LEAVE FORMS, YOU MUST BRING THEM TO THE OFFICE FOR PROCESSING.   DO NOT GIVE THEM TO YOUR DOCTOR.  1. Take your usually prescribed medications unless otherwise directed.  Take a stool softener twice a day for one week than as needed to keep stools soft.                             2.   Resume your normal diet. 3. Most patients will experience some swelling and discomfort in the rectal area. Ice packs, reclining and warm tub soaks will help.  Swelling and discomfort can take several days to resolve.  4. It is common to experience some constipation if taking pain medication after surgery.  Increasing fluid intake and taking a stool softener (such as Colace) will usually help or prevent this problem from occurring.  A mild laxative (Milk of Magnesia or Miralax) should be taken according to package directions if there are no bowel movements after 48 hours. 5. Unless discharge instructions indicate otherwise, leave your bandage dry and in place for 24 hours, or remove the bandage if you have a bowel movement. You may notice a small amount of bleeding with bowel movements for the first few days. You may have some packing in the rectum which will come out over the first day or two. You will need to wear an absorbent pad or soft cotton gauze in your underwear until the drainage stops.it. 6. ACTIVITIES:  You may resume regular (light) daily activities beginning the next day--such as daily self-care, walking, climbing stairs--gradually increasing activities as tolerated.  You may have sexual intercourse when it is comfortable.  Refrain from any heavy lifting or straining for one week. a. You may drive when you are no longer taking prescription pain medication, you can  comfortably wear a seatbelt, and you can safely maneuver your car and apply brakes. b. RETURN TO WORK: : ____________________ c.  7. You should see your doctor in the office for a follow-up appointment approximately 2-3 weeks after your surgery.  Make sure that you call for this appointment within a day or two after you arrive home to insure a convenient appointment time. 8. OTHER INSTRUCTIONS:  __________________________________________________________________________________________________________________________________________________________________________________________  WHEN TO CALL YOUR DOCTOR: 1. Fever over 101.5 2. Inability to urinate 3. Nausea and/or vomiting 4. Extreme swelling or bruising 5. Continued bleeding from rectum. 6. Increased pain, redness, or drainage from the incision 7. Constipation  The clinic staff is available to answer your questions during regular business hours.  Please don't hesitate to call and ask to speak to one of the nurses for clinical concerns.  If you have a medical emergency, go to the nearest emergency room or call 911.  A surgeon from [facility] is always on call at the hospital   [address], [address], [place], [place]  [address] ?  [address] [address], [place], [place]   [address] [phone] ? [phone] ? FAX [phone] Web site: [email or web address]

## 2012-01-20 NOTE — Progress Notes (Signed)
1 Day Post-Op  Subjective: Has no pain.  Minimal drainage.  Objective: Vital signs in last 24 hours: Temp:  [96.8 F (36 C)-98.4 F (36.9 C)] 97.8 F (36.6 C) (04/09 0557) Pulse Rate:  [61-72] 70  (04/09 0557) Resp:  [14-24] 18  (04/09 0557) BP: (111-140)/(54-80) 135/80 mmHg (04/09 0557) SpO2:  [95 %-100 %] 95 % (04/09 0557) Weight:  [150 lb (68.04 kg)] 150 lb (68.04 kg) (04/08 1334) Last BM Date: 01/17/12  Intake/Output from previous day: 04/08 0701 - 04/09 0700 In: 3539.7 [P.O.:1040; I.V.:2499.7] Out: 2070 [Urine:2050; Blood:20] Intake/Output this shift:    PE: Anorectal-no bleeding, dressing is dry.  Lab Results:  No results found for this basename: WBC:2,HGB:2,HCT:2,PLT:2 in the last 72 hours BMET No results found for this basename: NA:2,K:2,CL:2,CO2:2,GLUCOSE:2,BUN:2,CREATININE:2,CALCIUM:2 in the last 72 hours PT/INR No results found for this basename: LABPROT:2,INR:2 in the last 72 hours Comprehensive Metabolic Panel:    Component Value Date/Time   NA 135 01/06/2012 1145   K 5.0 01/06/2012 1145   CL 99 01/06/2012 1145   CO2 29 01/06/2012 1145   BUN 22 01/06/2012 1145   CREATININE 1.02 01/06/2012 1145   GLUCOSE 95 01/06/2012 1145   CALCIUM 9.5 01/06/2012 1145   AST 22 01/06/2012 1145   ALT 15 01/06/2012 1145   ALKPHOS 103 01/06/2012 1145   BILITOT 0.5 01/06/2012 1145   PROT 6.7 01/06/2012 1145   ALBUMIN 3.7 01/06/2012 1145     Studies/Results: No results found.  Anti-infectives: Anti-infectives     Start     Dose/Rate Route Frequency Ordered Stop   01/19/12 0600   cefOXitin (MEFOXIN) 1 g in dextrose 5 % 50 mL IVPB        1 g 100 mL/hr over 30 Minutes Intravenous 60 min pre-op 01/18/12 2228 01/19/12 1610          Assessment Active Problems:  Status post transanal excision of distal rectal polyps-stable overnight.    LOS: 1 day   Plan: Discharge today.  Instructions given.   Hollan Philipp J 01/20/2012

## 2012-01-22 ENCOUNTER — Telehealth (INDEPENDENT_AMBULATORY_CARE_PROVIDER_SITE_OTHER): Payer: Self-pay

## 2012-01-22 NOTE — Telephone Encounter (Signed)
The patient called today and reported she had vertigo yesterday but took her medicine which helped some but today she is weak and exhausted.  She has brown liquid drainage from her rectum but that was happening preop.  She is taking a stool softener BID.  Her last bm was Saturday before surgery.  She is not nauseated and has no fever.  I paged Dr Abbey Chatters who advises continue the stool softener, take daily Miralax, increase her fluids and make sure she eats.  She should also not take anything per rectum.  I notified the pt.

## 2012-01-30 ENCOUNTER — Ambulatory Visit: Payer: Medicare Other | Admitting: Internal Medicine

## 2012-02-10 ENCOUNTER — Encounter (INDEPENDENT_AMBULATORY_CARE_PROVIDER_SITE_OTHER): Payer: Self-pay | Admitting: General Surgery

## 2012-02-10 ENCOUNTER — Ambulatory Visit (INDEPENDENT_AMBULATORY_CARE_PROVIDER_SITE_OTHER): Payer: Medicare Other | Admitting: General Surgery

## 2012-02-10 VITALS — BP 147/94 | HR 68 | Temp 97.6°F | Ht 61.0 in | Wt 154.2 lb

## 2012-02-10 DIAGNOSIS — Z9889 Other specified postprocedural states: Secondary | ICD-10-CM

## 2012-02-10 NOTE — Patient Instructions (Signed)
Call for any problems.

## 2012-02-10 NOTE — Progress Notes (Signed)
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 Benign anal polyp.  HPI: She is here for her first postoperative visit. Initially she had a little bit of pink discharge from her rectum but that has improved. No pain   Physical Exam: Anal rectal-on digital rectal exam some firm irregularity is felt consistent with postop scarring  Assessment:  She is doing well and pathology is benign.  Plan:  I told her I would leave that up to Dr. Rhea Belton to decide when her next colonoscopy should be done.  RTC prn.

## 2012-10-19 ENCOUNTER — Encounter: Payer: Self-pay | Admitting: Internal Medicine

## 2012-10-19 IMAGING — CR DG ABDOMEN 1V
1 series · 1 of 1 positions shown · non-contrast
Comparison: None

CLINICAL DATA: Vomiting.

ABDOMEN - 1 VIEW

[x abdomen supine]
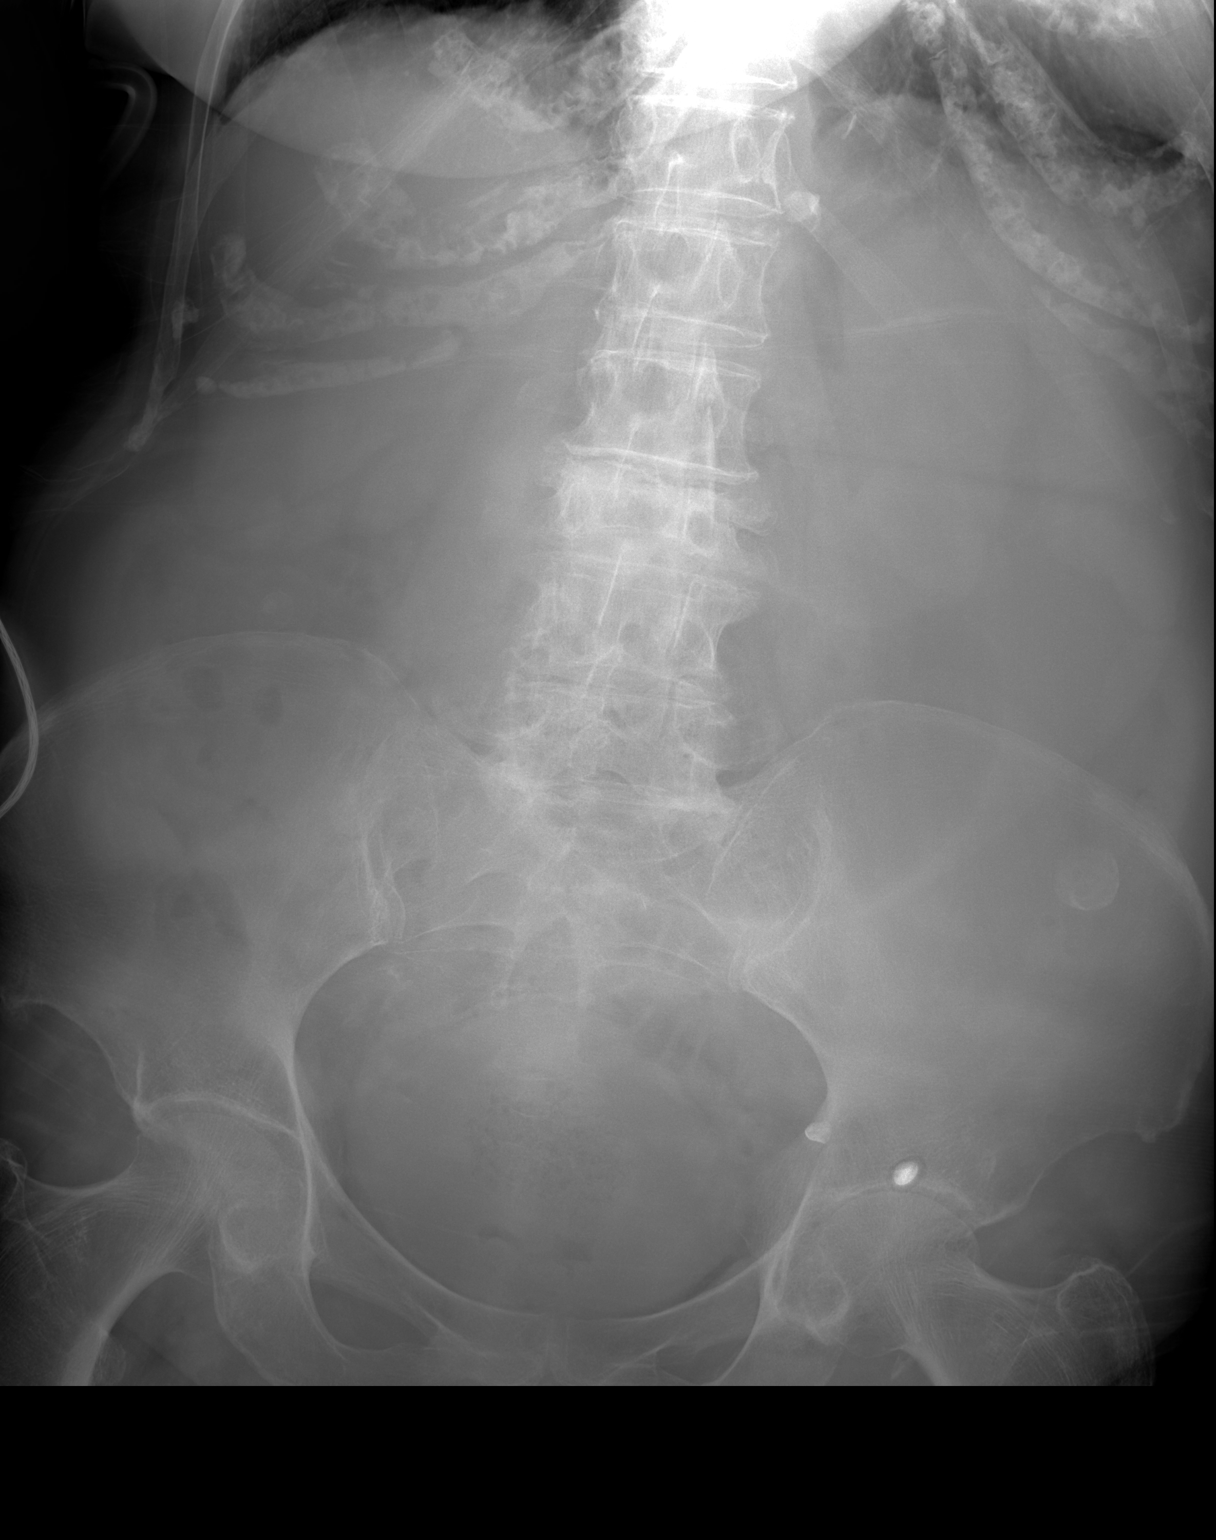

[1 of 1 positions shown; findings below may reference images not displayed]

FINDINGS: Nonobstructed bowel gas pattern.  Relative paucity gas
throughout the abdomen and pelvis.  No free air on the supine view.
No organomegaly or suspicious calcification.  Degenerative changes
in the lumbar spine.  Apparent mild to moderate compression
fracture at L5.
IMPRESSION: No evidence of obstruction.  No acute findings.

## 2012-10-19 IMAGING — CR DG ABDOMEN ACUTE W/ 1V CHEST
4 series · 4 of 4 positions shown · non-contrast
Comparison: 06/13/2011

CLINICAL DATA: UTI.  Nausea.  Vomiting.  Abdominal pain.

ACUTE ABDOMEN SERIES (ABDOMEN 2 VIEW & CHEST 1 VIEW)

[x chest ap]
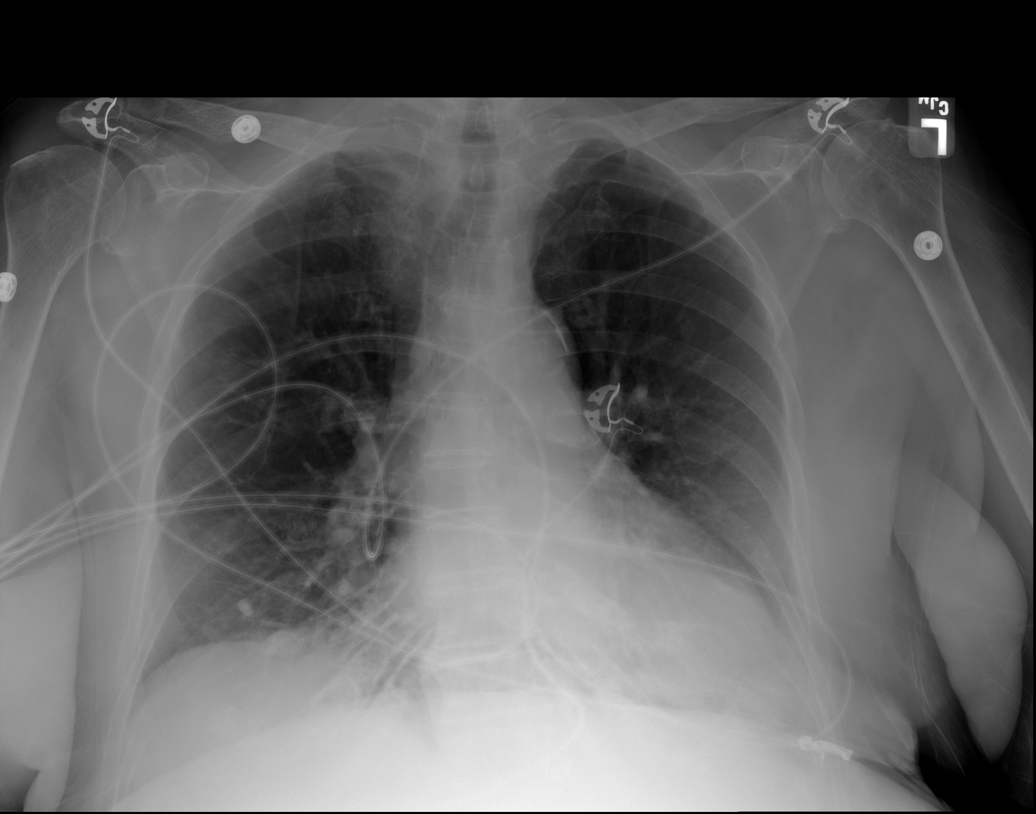

[x abdomen supine (1 of 2)]
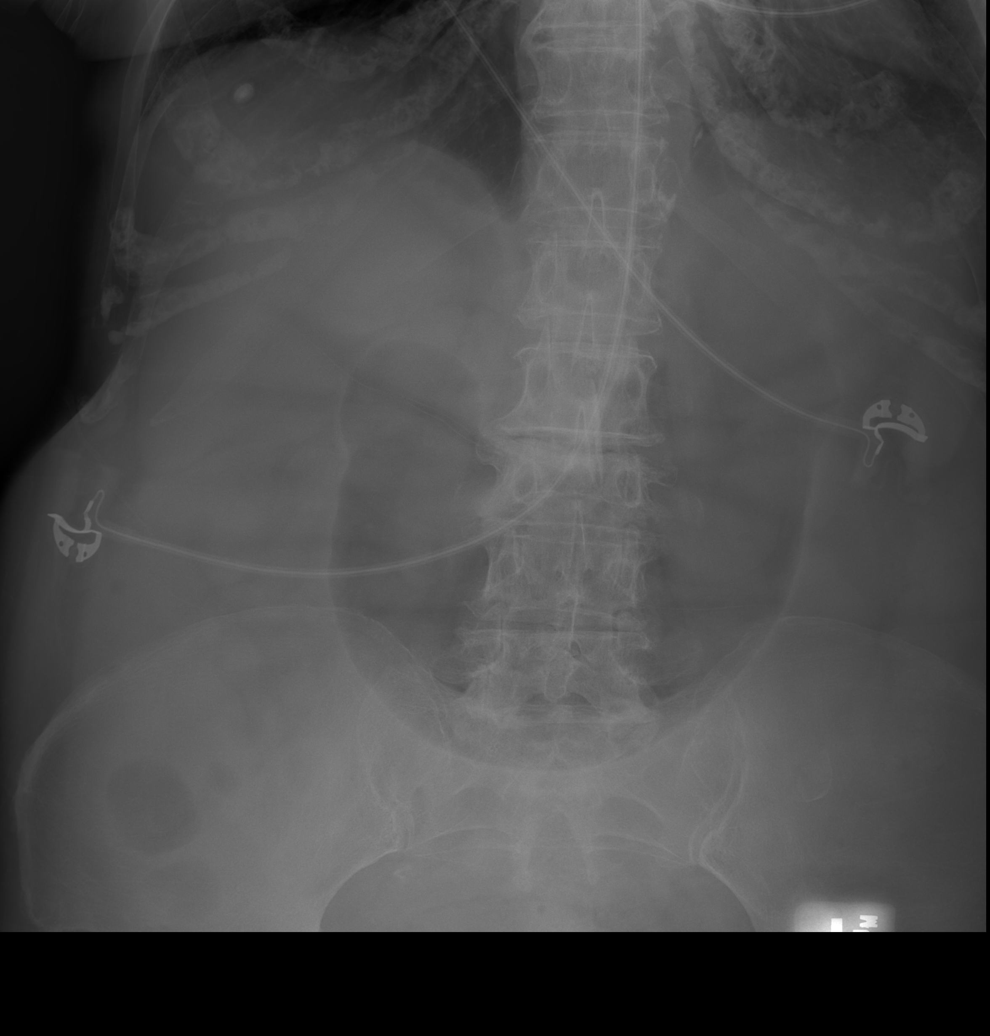

[x abdomen supine (2 of 2)]
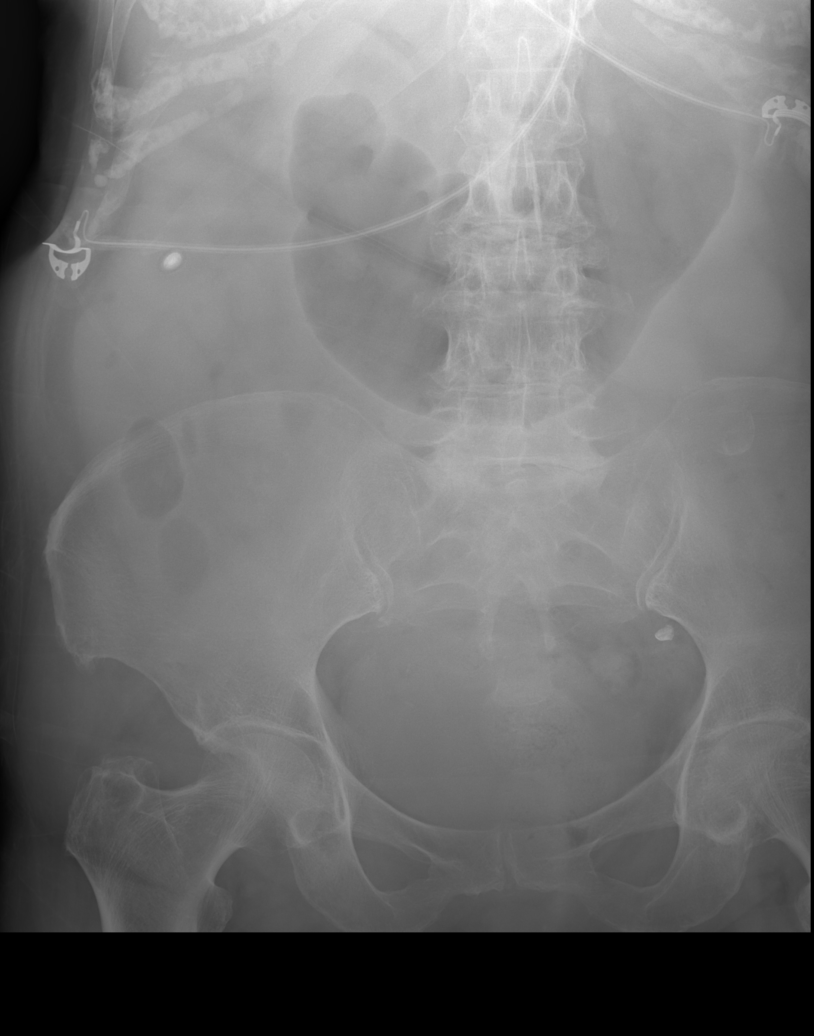

[w abdomen decub]
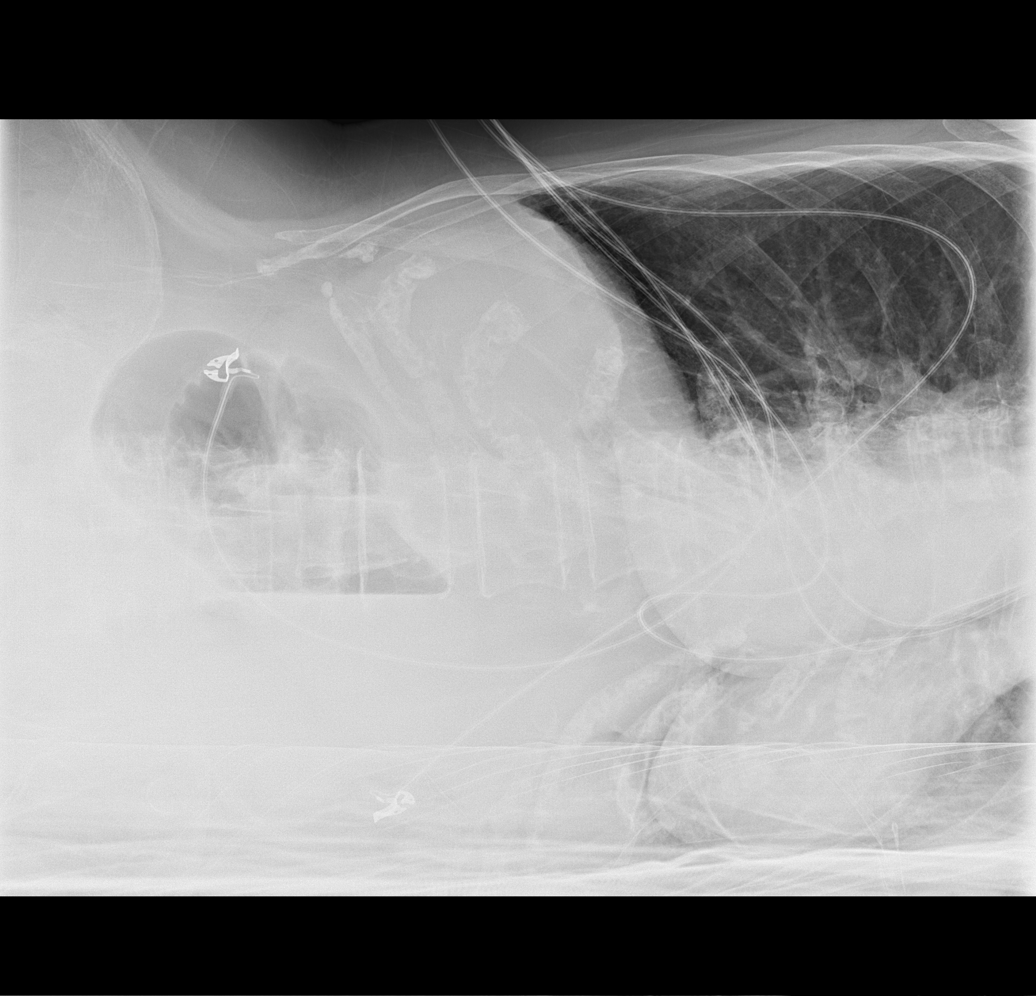

[4 of 4 positions shown; findings below may reference images not displayed]

FINDINGS: Normal heart size and pulmonary vascularity.  No focal
consolidation in the lungs.  No blunting of costophrenic angles.

Moderate gaseous distension of the stomach which might be due to
decreased motility or early outlet obstruction.  Scattered gas and
stool in the colon.  No small or large bowel dilatation.  No free
intra-abdominal air.  No abnormal air fluid levels.  No radiopaque
stones. Degenerative changes in the spine and hips.
IMPRESSION: Moderate gaseous distension of the stomach.  No evidence of small
bowel obstruction.  No evidence of active pulmonary disease.

## 2012-11-26 ENCOUNTER — Ambulatory Visit: Payer: Medicare Other | Admitting: Internal Medicine

## 2012-12-02 ENCOUNTER — Ambulatory Visit: Payer: Medicare Other | Admitting: Internal Medicine

## 2013-01-13 ENCOUNTER — Encounter: Payer: Self-pay | Admitting: Internal Medicine

## 2013-01-18 ENCOUNTER — Encounter: Payer: Self-pay | Admitting: Internal Medicine

## 2013-01-18 ENCOUNTER — Ambulatory Visit (INDEPENDENT_AMBULATORY_CARE_PROVIDER_SITE_OTHER): Payer: Medicare Other | Admitting: Internal Medicine

## 2013-01-18 VITALS — BP 110/66 | HR 61 | Ht 60.0 in | Wt 155.0 lb

## 2013-01-18 DIAGNOSIS — D128 Benign neoplasm of rectum: Secondary | ICD-10-CM

## 2013-01-18 DIAGNOSIS — K219 Gastro-esophageal reflux disease without esophagitis: Secondary | ICD-10-CM

## 2013-01-18 MED ORDER — RANITIDINE HCL 300 MG PO TABS: 300.0000 mg | ORAL_TABLET | Freq: Every day | ORAL | Status: DC

## 2013-01-18 NOTE — Patient Instructions (Addendum)
We have sent the following medications to your pharmacy for you to pick up at your convenience: Zantac; take 1 in the morning and one in the evening before bed.  Follow up with Dr. Rhea Belton as needed                                               We are excited to introduce MyChart, a new best-in-class service that provides you online access to important information in your electronic medical record. We want to make it easier for you to view your health information - all in one secure location - when and where you need it. We expect MyChart will enhance the quality of care and service we provide.  When you register for MyChart, you can:    View your test results.    Request appointments and receive appointment reminders via email.    Request medication renewals.    View your medical history, allergies, medications and immunizations.    Communicate with your physician's office through a password-protected site.    Conveniently print information such as your medication lists.  To find out if MyChart is right for you, please talk to a member of our clinical staff today. We will gladly answer your questions about this free health and wellness tool.  If you are age 77 or older and want a member of your family to have access to your record, you must provide written consent by completing a proxy form available at our office. Please speak to our clinical staff about guidelines regarding accounts for patients younger than age 65.  As you activate your MyChart account and need any technical assistance, please call the MyChart technical support line at [phone]) or email your question to [email or web address]. If you email your question(s), please include your name, a return phone number and the best time to reach you.  If you have non-urgent health-related questions, you can send a message to our office through MyChart at [email or web address]. If you have a medical emergency,  call 911.  Thank you for using MyChart as your new health and wellness resource!   MyChart licensed from [employer],  4540-9811. Patents Pending.

## 2013-01-18 NOTE — Progress Notes (Signed)
Subjective:    Patient ID: Heather Parker, female    DOB: 20-Oct-1921, 79 y.o.   MRN: [id number]  HPI Mrs. Redmon is an 79 yo female with PMH of HTN, GERD, hemorrhoids, osteoarthritis, and adenomatous colon polyps who seen in followup.  She underwent transanal excision of a large tubulovillous adenoma in the rectum on 01/19/2012 by Dr. Abbey Chatters.  Since the time of the surgery she has done well. Her bowel movements have been normal, but she does continue to have scant brown discharge/seepage. Her anal seepage usually occurs after bowel movement and has been long-standing. It did not improve after resection of the large rectal polyp.  Otherwise she's had no blood in her stool or melena. No abdominal pain. She is eating well. She does continue to have issues with "indigestion" and heartburn. This is worse during the daytime, and ranitidine seems to help significantly at night. She reports it is worse with foods such as meats, radishes, orange juice and lemonade.  This has been long-standing for her. No dysphagia, odynophagia. No nausea or vomiting. No early satiety.  Of note prior upper endoscopy revealed a hiatal hernia but was otherwise unremarkable in February 2013  She continues to exercise doing water aerobics, yoga, and stationary biking  Review of Systems As per history of present illness, otherwise negative  Current Medications, Allergies, Past Medical History, Past Surgical History, Family History and Social History were reviewed in [employer] record.      Objective:   Physical Exam BP 110/66  Pulse 61  Ht 5' (1.524 m)  Wt 155 lb (70.308 kg)  BMI 30.27 kg/m2 Constitutional: Well-developed and well-nourished. No distress. HEENT: Normocephalic and atraumatic.  Conjunctivae are normal.  No scleral icterus. Cardiovascular: Normal rate, regular rhythm and intact distal pulses. No M/R/G Pulmonary/chest: Effort normal and breath sounds normal. No wheezing, rales or  rhonchi. Abdominal: Soft, nontender, nondistended. Bowel sounds active throughout.  Extremities: no clubbing, cyanosis, or edema Neurological: Alert and oriented to person place and time. Skin: Skin is warm and dry. No rashes noted. Psychiatric: Normal mood and affect. Behavior is normal.    Assessment & Plan:   79 yo female with PMH of HTN, GERD, hemorrhoids, osteoarthritis, and adenomatous colon polyps who seen in followup.  1.  GERD -- she continues to have mostly daytime GERD symptoms which are long-standing. Her upper endoscopy one year ago is reassuring. At our last visit I had recommended twice daily ranitidine, but it does not appear this was instituted.  I think adding a morning dose of an H2 blocker will help her symptoms greatly. She had previously tried pantoprazole, but did not benefit.  There are no other alarm symptoms and her endoscopy was reassuring.  I've asked that she call me should her symptoms change or worsen, or fail to respond to twice daily ranitidine.  She voices understand and is happy with this plan  2.  History of adenomatous colon polyps/transanal excision rectal adenoma -- the patient had a transanal excision of her rectal tubulovillous adenoma. There was no high-grade dysplasia or adenocarcinoma. At followup with Dr. Abbey Chatters this lesion was felt to be completely excised. She has done well without any lower abdominal alarm symptoms.  He has continued to have some fecal seepage which is felt to be secondary to pelvic floor dysfunction and decreased anal sphincter tone.  We discussed repeat lower endoscopy for surveillance, but at this point she does not wish to pursue this.  By guidelines, she would  be due surveillance at the 3 year mark, based on tubulovillous architecture and size greater than a centimeter (without high-grade dysplasia or malignancy) however given her age this may not be necessary.  We can rediscuss this in followup which will be based on her overall  clinical health at that time.  She is happy with this plan.

## 2013-03-02 ENCOUNTER — Encounter: Payer: Self-pay | Admitting: Geriatric Medicine

## 2013-03-02 ENCOUNTER — Non-Acute Institutional Stay: Payer: Medicare Other | Admitting: Geriatric Medicine

## 2013-03-02 VITALS — BP 144/80 | HR 72 | Ht 59.5 in | Wt 156.0 lb

## 2013-03-02 DIAGNOSIS — I10 Essential (primary) hypertension: Secondary | ICD-10-CM

## 2013-03-02 DIAGNOSIS — D128 Benign neoplasm of rectum: Secondary | ICD-10-CM

## 2013-03-02 DIAGNOSIS — K219 Gastro-esophageal reflux disease without esophagitis: Secondary | ICD-10-CM

## 2013-03-02 DIAGNOSIS — D129 Benign neoplasm of anus and anal canal: Secondary | ICD-10-CM

## 2013-03-02 DIAGNOSIS — R21 Rash and other nonspecific skin eruption: Secondary | ICD-10-CM

## 2013-03-02 DIAGNOSIS — IMO0002 Reserved for concepts with insufficient information to code with codable children: Secondary | ICD-10-CM

## 2013-03-02 DIAGNOSIS — M1990 Unspecified osteoarthritis, unspecified site: Secondary | ICD-10-CM

## 2013-03-02 MED ORDER — ACETAMINOPHEN 500 MG PO TABS: 500.0000 mg | ORAL_TABLET | Freq: Two times a day (BID) | ORAL | Status: DC

## 2013-03-02 MED ORDER — MENTHOL-ZINC OXIDE 0.44-20.625 % EX OINT: 1.0000 "application " | TOPICAL_OINTMENT | Freq: Every day | CUTANEOUS | Status: DC

## 2013-03-02 NOTE — Progress Notes (Signed)
Patient ID: Heather Parker, female   DOB: 05/05/1922, 79 y.o.   MRN: [id number] [facility] [phone])  Chief Complaint  Patient presents with  . Medical Managment of Chronic Issues    blood pressure, GERD    HPI: This is a 79 y.o. female resident of [facility], Independent Living section evaluated today for management of ongoing medical issues.  Patient has blood pressure checked periodically by the clinic nurse, it remains satisfactory. GERD symptoms are under adequate control, she continues to use H2 blocker as needed. Patient has followup with Dr. Rhea Belton on 01/18/2013.  The discussed followup of her tubovillous rectal adenoma that was excised 4 2013. She would be due fro colononoscopy at 3 years, due to this patient's age this may not be necessary. They plan to have further discussion at the next routine followup.  Patient wants to discuss her back pain today; she reports that it hurts all the time except when she is lying down. She rates this pain 7/10 by the end of the day. She continues to perform her ADLs independently, does exercise 3-4 times a week including water exercise and exercise class. She continues to be active despite her pain. Stopped taking Aleve due to concerns for GI distress, takes Tylenol once in a while reports that it seems to be a little bit helpful. A relative has suggested tramadol as a possible alternative.    Allergies  Allergies  Allergen Reactions  . Codeine Nausea And Vomiting  . Darvocet (Propoxyphene-Acetaminophen) Other (See Comments)    Caused her to pass out  . Darvon Other (See Comments)    Pt unsure of reaction   Medications  Current outpatient prescriptions:acetaminophen (TYLENOL) 500 MG tablet, Take 1 tablet (500 mg total) by mouth 2 (two) times daily. Take one tablet twice daily to reduce back pain, Disp: , Rfl: ;  Biotin 5000 MCG CAPS, Take 1 capsule by mouth daily. , Disp: , Rfl: ;  ergocalciferol (VITAMIN  D2) 50000 UNITS capsule, Take 50,000 Units by mouth every 14 (fourteen) days. , Disp: , Rfl:  meclizine (ANTIVERT) 25 MG tablet, Take 25 mg by mouth. Take one up to 4 times daily as needed for dizziness, Disp: , Rfl: ;  Multiple Vitamins-Minerals (CENTRUM PO), Take 1 tablet by mouth every morning. , Disp: , Rfl: ;  nebivolol (BYSTOLIC) 10 MG tablet, Take 10 mg by mouth every morning. , Disp: , Rfl: ;  NIFEdipine (PROCARDIA-XL/ADALAT CC) 30 MG 24 hr tablet, Take 30 mg by mouth every morning. , Disp: , Rfl:  ranitidine (ZANTAC) 300 MG tablet, Take 1 tablet (300 mg total) by mouth daily. Only takes if needed, Disp: 60 tablet, Rfl: 6;  Menthol-Zinc Oxide 0.44-20.625 % OINT, Apply 1 application topically daily. Apply small amount to irritated skin daily after shower, Disp: , Rfl: ;  [DISCONTINUED] pantoprazole (PROTONIX) 40 MG tablet, Take 1 tablet (40 mg total) by mouth daily., Disp: 30 tablet, Rfl: 5   Data Reviewed       JWJ:[id number], external 05/25/2012 BMP: Sodium 137, Potassium 4.8, glucose 110, BUN 18, Creatinine 1.04 TSH 2.096 10/26/2012 CBC: Wbc 9.5, Rbc 4.56, Hgb 13.6, Hct 40.3, Platelet 317 CMP: Sodium 139, Potassium 4.9, glucose 115, BUN 21, Creatinine 1.10 Lipid: cholesterol 188, triglyceride 90, HDL 50, LDL 120     Review of Systems  DATA OBTAINED: from patient GENERAL: Feels well No fevers, fatigue, change in appetite or weight SKIN: No itch, rash or open wounds.  EYES: No eye  pain, dryness or itching  No change in vision EARS: No earache, tinnitus, change in hearing NOSE: No congestion, drainage or bleeding MOUTH/THROAT: No mouth or tooth pain  No difficulty chewing or swallowing RESPIRATORY: No cough, wheezing, SOB CARDIAC: No chest pain, palpitations  No edema. GI: No abdominal pain  No N/V/D or constipation  Occ. heartburn or reflux (not new)  Fecal seepage /anal skin irritation GU: No dysuria, frequency or urgency  No change in urine volume or character No nocturia or change in  stream   MUSCULOSKELETAL:   Back pain, see HPI. Rt. Knee pain No muscle ache, pain, weakness  Gait is steady  No recent falls.  NEUROLOGIC: No dizziness, fainting, headache  No change in mental status.  PSYCHIATRIC: No feelings of anxiety, depression Sleeps well.     Physical Exam Filed Vitals:   03/02/13 1134  BP: 144/80  Pulse: 72  Height: 4' 11.5" (1.511 m)  Weight: 156 lb (70.761 kg)   Body mass index is 30.99 kg/(m^2).  GENERAL APPEARANCE: No acute distress, appropriately groomed, normal body habitus. Alert, pleasant, conversant. HEAD: Normocephalic, atraumatic EYES: Conjunctiva/lids clear.  EARS:  Hearing grossly normal. NOSE: No deformity or discharge. MOUTH/THROAT: Lips w/o lesions. Oral mucosa, tongue moist, w/o lesion. Oropharynx w/o redness or lesions.  NECK: Supple, full ROM. No thyroid tenderness, enlargement or nodule LYMPHATICS: No head, neck or supraclavicular adenopathy RESPIRATORY: Breathing is even, unlabored. Lung sounds are clear and full.  CARDIOVASCULAR: Heart RRR. No murmur or extra heart sounds  EDEMA: No peripheral  edema. GASTROINTESTINAL: Abdomen is soft, non-tender, not distended w/ normal bowel sounds.   RECTAL: Decreased anal sphincter tone perianal and buttock skin irritation present, No anal fissure, skin tag or external hemorrhoid.  MUSCULOSKELETAL: Moves all extremities with full ROM, strength and tone. Back is without kyphosis, scoliosis or spinal process tenderness. Gait is steady NEUROLOGIC: Oriented to time, place, person. Speech clear, no tremor.  PSYCHIATRIC: Mood and affect appropriate to situation  ASSESSMENT/PLAN  Tubulovillous adenoma of rectum Will be followed by Dr.J.Pyrtle  Rash and nonspecific skin eruption Perianal and buttock skin irritation due to fecal seepage. Recommend skin barrier/protectant, Calmoeptine be applied daily  Degenerative disc disease Chronic back pain has really not changed, patient rates it at 6-7/10 at  it's worst. She continues to pursue activities she desires, would like some relief from discomfort. As Tylenol seems to provide some relief have recommended she take his medicine twice daily to reduce the back pain. Discussed that her pian will not be completely removed, but if it can be reduce to 5/10 she would be happier.   Osteoarthritis Pt. Has been evaluated by Dr. Ranell Patrick for R knee pain. Will be considering knee injection for pain management.  Greater than 40 minutes spent with patient/chart  Follow up: 3 months  Eleanna Theilen T.Amie Cowens, NP-C 03/02/2013

## 2013-03-10 ENCOUNTER — Encounter: Payer: Self-pay | Admitting: Geriatric Medicine

## 2013-03-10 NOTE — Assessment & Plan Note (Signed)
Pt. Has been evaluated by Dr. Ranell Patrick for R knee pain. Will be considering knee injection for pain management.

## 2013-03-10 NOTE — Assessment & Plan Note (Signed)
Perianal and buttock skin irritation due to fecal seepage. Recommend skin barrier/protectant, Calmoeptine be applied daily

## 2013-03-10 NOTE — Assessment & Plan Note (Signed)
Will be followed by Dr.J.Pyrtle

## 2013-03-10 NOTE — Assessment & Plan Note (Signed)
Chronic back pain has really not changed, patient rates it at 6-7/10 at it's worst. She continues to pursue activities she desires, would like some relief from discomfort. As Tylenol seems to provide some relief have recommended she take his medicine twice daily to reduce the back pain. Discussed that her pian will not be completely removed, but if it can be reduce to 5/10 she would be happier.

## 2013-03-10 NOTE — Assessment & Plan Note (Signed)
Reflux symptoms are pretty well controlled when patient takes H2 blocker. Encouraged her to take this medication as directed.

## 2013-03-10 NOTE — Assessment & Plan Note (Signed)
Blood pressure remained stable, continue current medication. 

## 2013-06-01 ENCOUNTER — Encounter: Payer: Self-pay | Admitting: Geriatric Medicine

## 2013-06-01 ENCOUNTER — Non-Acute Institutional Stay: Payer: Medicare Other | Admitting: Geriatric Medicine

## 2013-06-01 VITALS — BP 122/72 | HR 76 | Ht 59.5 in | Wt 152.0 lb

## 2013-06-01 DIAGNOSIS — IMO0002 Reserved for concepts with insufficient information to code with codable children: Secondary | ICD-10-CM

## 2013-06-01 DIAGNOSIS — I10 Essential (primary) hypertension: Secondary | ICD-10-CM

## 2013-06-01 DIAGNOSIS — R7309 Other abnormal glucose: Secondary | ICD-10-CM

## 2013-06-01 DIAGNOSIS — M1990 Unspecified osteoarthritis, unspecified site: Secondary | ICD-10-CM

## 2013-06-01 DIAGNOSIS — L853 Xerosis cutis: Secondary | ICD-10-CM

## 2013-06-01 DIAGNOSIS — L738 Other specified follicular disorders: Secondary | ICD-10-CM

## 2013-06-01 HISTORY — DX: Other abnormal glucose: R73.09

## 2013-06-01 HISTORY — DX: Xerosis cutis: L85.3

## 2013-06-01 NOTE — Assessment & Plan Note (Addendum)
Chronic back pain after fall/ lumbar fracture 2011. MRI result from 5/ 2011 revealed multilevel DDD,  had a series of 3 back injections in 2012 that were not effective.  Back pain continues to affect this patient daily, she is able to maintain daily exercise however standing and walking is quite painful for her. B.i.d. dosing of Tylenol reduces the pain somewhat. She is interested in considering spinal injections if that would be helpful. She is not interested in pursuing surgical intervention. Patient is returning to Dr. Ranell Patrick next week for reevaluation of her osteoarthritis in her knee, will defer consideration of  back injection recommendation to him, will send copy of MRI from 2011 with patient.

## 2013-06-01 NOTE — Assessment & Plan Note (Signed)
Blood pressure remains well-controlled on current medication, recent lab studies satisfactory.

## 2013-06-01 NOTE — Assessment & Plan Note (Addendum)
Fasting blood sugar continues to remain mildly elevated. Will obtain hemoglobin A1c for wider picture of her blood sugar levels. Encouraged exercise as she tolerates and efforts at mild weight loss.

## 2013-06-01 NOTE — Assessment & Plan Note (Signed)
Patient with very thin skin due to age and significant sun exposure in earlier years. Skin is also very dry, exacerbated by 3 times a week water aerobic exercise. Recommend emollient skin cream application after showering and pool to help improve general skin condition.

## 2013-06-01 NOTE — Progress Notes (Signed)
Patient ID: Heather Parker, female   DOB: [birth year], 79 y.o.   MRN: [id number] [facility] [phone])  Code Status: Full Code  Contact Information   Name Relation Home Work Mobile   Heather [facility] Daughter [phone]        Chief Complaint  Patient presents with  . Medical Managment of Chronic Issues    blood pressure, DDD    HPI: This is a 79 y.o. female resident of [facility] section evaluated today for management of ongoing medical issues.  Patient has blood pressure checked periodically by the clinic nurse, it remains satisfactory. Recent lab reveals fasting blood sugar  remains mildly elevated, remainder of CMP is within normal limits, TSH satisfactory as well. Patient has lost 4 pounds since last visit, this weight loss is intentional.  Patient's back pain continues to be a problem. She has history of vertebral fracture as well as degenerative disc disease by MRI. Patient reports daily pain somewhat relieved with b.i.d. dosing of Tylenol. She feels she's pretty good until about 2 PM. She has no pain when lying down and sitting is usually okay. Her mobility is limited as the pain becomes quite severe when she is standing and walking. She rates pain at 7/10. Using a walker helps relieve the pain. Patient does continue to be active with exercise she does water aerobics 3 times a week reports she feels great in the water, no pain. She also participates in chair exercise 3 days a week and one day a week uses the new step machine. Patient did have a series of injections in her back a few years ago before she moved to [place] reports they were not very helpful. She is wondering if back injections could be tried again.   Patient has chronic knee pain related to osteoarthritis. She is followed by Dr. Ranell Parker for this problem. Received a right knee injection about 6 months ago reports she had very good relief with this. The pain has  returned, she scheduled to return to Dr. Ranell Parker next week.  Allergies  Allergies  Allergen Reactions  . Codeine Nausea And Vomiting  . Darvocet [Propoxyphene-Acetaminophen] Other (See Comments)    Caused her to pass out  . Darvon Other (See Comments)    Pt unsure of reaction   Medications    Medication List       This list is accurate as of: 06/01/13  9:47 AM.  Always use your most recent med list.               acetaminophen 500 MG tablet  Commonly known as:  TYLENOL  Take 1 tablet (500 mg total) by mouth 2 (two) times daily. Take one tablet twice daily to reduce back pain     Biotin 5000 MCG Caps  Take 1 capsule by mouth daily.     CENTRUM PO  Take 1 tablet by mouth every morning.     ergocalciferol 50000 UNITS capsule  Commonly known as:  VITAMIN D2  Take 50,000 Units by mouth every 14 (fourteen) days.     meclizine 25 MG tablet  Commonly known as:  ANTIVERT  Take 25 mg by mouth. Take one up to 4 times daily as needed for dizziness     Menthol-Zinc Oxide 0.44-20.625 % Oint  Apply 1 application topically daily. Apply small amount to irritated skin daily after shower     nebivolol 10 MG tablet  Commonly known as:  BYSTOLIC  Take 10 mg  by mouth every morning.     NIFEdipine 30 MG 24 hr tablet  Commonly known as:  PROCARDIA-XL/ADALAT CC  Take 30 mg by mouth every morning.     ranitidine 300 MG tablet  Commonly known as:  ZANTAC  Take 1 tablet (300 mg total) by mouth daily. Only takes if needed        Data Reviewed       [id number], external 05/25/2012 BMP: Sodium 137, Potassium 4.8, glucose 110, BUN 18, Creatinine 1.04 TSH 2.096 10/26/2012 CBC: Wbc 9.5, Rbc 4.56, Hgb 13.6, Hct 40.3, Platelet 317 CMP: Sodium 139, Potassium 4.9, glucose 115, BUN 21, Creatinine 1.10 Lipid: cholesterol 188, triglyceride 90, HDL 50, LDL 120  05/26/2013 glucose 1:15, BUN 19, creatinine 1.06, sodium 139, potassium 4.8. Protein/LFTs WNL.  TSH 2.201    Review of Systems   DATA OBTAINED: from patient GENERAL: Feels well No fevers, fatigue, change in appetite or weight SKIN: Skin is itchy, dry. No rash or open wounds.  EYES: No eye pain, dryness or itching  No change in vision EARS: No earache, tinnitus, change in hearing NOSE: No congestion, drainage or bleeding MOUTH/THROAT: No mouth or tooth pain  No difficulty chewing or swallowing RESPIRATORY: No cough, wheezing, SOB CARDIAC: No chest pain, palpitations  No edema. GI: No abdominal pain  No N/V/D or constipation  Occ. heartburn or reflux (not new)  Fecal seepage /anal skin irritation GU: No dysuria, frequency or urgency  No change in urine volume or character No nocturia or change in stream   MUSCULOSKELETAL:   Back pain, see HPI. Rt. Knee pain No muscle ache, pain, weakness  Gait is steady  No recent falls.  NEUROLOGIC: No dizziness, fainting, headache  No change in mental status.  PSYCHIATRIC: No feelings of anxiety, depression Sleeps well.     Physical Exam Filed Vitals:   06/01/13 0855  BP: 122/72  Pulse: 76  Height: 4' 11.5" (1.511 m)  Weight: 152 lb (68.947 kg)   Body mass index is 30.2 kg/(m^2).  GENERAL APPEARANCE: No acute distress, appropriately groomed, overweight body habitus, pt has lost 4lbs since last visit(intentional). Alert, pleasant, conversant. SKIN: No rash or unusual lesions. There is evidence of long-term skin damage. HEAD: Normocephalic, atraumatic EYES: Conjunctiva/lids clear.  EARS:  Hearing grossly normal. NOSE: No deformity or discharge. MOUTH/THROAT: Lips w/o lesions. Oral mucosa, tongue moist, w/o lesion. Oropharynx w/o redness or lesions.  NECK: Supple, full ROM. No thyroid tenderness, enlargement or nodule LYMPHATICS: No head, neck or supraclavicular adenopathy RESPIRATORY: Breathing is even, unlabored. Lung sounds are clear and full.  CARDIOVASCULAR: Heart RRR. No murmur or extra heart sounds  EDEMA: No peripheral  edema. MUSCULOSKELETAL: Moves all extremities  with full ROM, strength and tone. Back is without kyphosis, scoliosis or spinal process tenderness. Gait is steady NEUROLOGIC: Oriented to time, place, person. Speech clear, no tremor.  PSYCHIATRIC: Mood and affect appropriate to situation  ASSESSMENT/PLAN  Degenerative disc disease Chronic back pain after fall/ lumbar fracture 2011. MRI result from 5/ 2011 revealed multilevel DDD,  had a series of 3 back injections in 2012 that were not effective.  Back pain continues to affect this patient daily, she is able to maintain daily exercise however standing and walking is quite painful for her. B.i.d. dosing of Tylenol reduces the pain somewhat. She is interested in considering spinal injections if that would be helpful. She is not interested in pursuing surgical intervention. Patient is returning to Dr. Ranell Parker next week for reevaluation of her  osteoarthritis in her knee, will defer consideration of  back injection recommendation to him, will send copy of MRI from 2011 with patient.  Osteoarthritis Patient had injection of her right knee by Dr. Ranell Parker, reports she had very good relief pain. Pain has returned, she has an appointment with Dr. Ranell Parker next week for another injection  Abnormal blood sugar Fasting blood sugar continues to remain mildly elevated. Will obtain hemoglobin A1c for wider picture of her blood sugar levels. Encouraged exercise as she tolerates and efforts at mild weight loss.  HTN (hypertension) Blood pressure remains well-controlled on current medication, recent lab studies satisfactory.  Xerosis cutis Patient with very thin skin due to age and significant sun exposure in earlier years. Skin is also very dry, exacerbated by 3 times a week water aerobic exercise. Recommend emollient skin cream application after showering and pool to help improve general skin condition.   Follow up: 3 months  [facility] T.Heather Mangels, NP-C 06/01/2013

## 2013-06-01 NOTE — Assessment & Plan Note (Signed)
Patient had injection of her right knee by Dr. Ranell Patrick, reports she had very good relief pain. Pain has returned, she has an appointment with Dr. Ranell Patrick next week for another injection

## 2013-06-06 ENCOUNTER — Other Ambulatory Visit: Payer: Self-pay | Admitting: Geriatric Medicine

## 2013-07-06 ENCOUNTER — Other Ambulatory Visit: Payer: Self-pay | Admitting: Geriatric Medicine

## 2013-07-16 ENCOUNTER — Other Ambulatory Visit: Payer: Self-pay | Admitting: Geriatric Medicine

## 2013-08-15 ENCOUNTER — Non-Acute Institutional Stay: Payer: Medicare Other | Admitting: Internal Medicine

## 2013-08-15 ENCOUNTER — Encounter: Payer: Self-pay | Admitting: Internal Medicine

## 2013-08-15 VITALS — BP 160/86 | HR 72 | Temp 97.4°F | Ht 59.5 in | Wt 151.0 lb

## 2013-08-15 DIAGNOSIS — R7309 Other abnormal glucose: Secondary | ICD-10-CM

## 2013-08-15 DIAGNOSIS — D128 Benign neoplasm of rectum: Secondary | ICD-10-CM

## 2013-08-15 DIAGNOSIS — I10 Essential (primary) hypertension: Secondary | ICD-10-CM

## 2013-08-15 DIAGNOSIS — K219 Gastro-esophageal reflux disease without esophagitis: Secondary | ICD-10-CM

## 2013-08-15 NOTE — Progress Notes (Signed)
Subjective:    Patient ID: Heather [facility], female    DOB: November 23, 1921, 79 y.o.   MRN: [id number]  HPI Indigestion all the time. Nauseous when she looks at food. History of GERD. On 11/25/11 had EGD by Dr. Rhea Belton. Has finding of small HH. No esophageal lesions. Complains of some burning in her stomach.  Brown discharge from her rectum  Has seen Dr. Rhea Belton. He referred to Dr. Abbey Chatters for an adenomatous polyp.  Current Outpatient Prescriptions on File Prior to Visit  Medication Sig Dispense Refill  . acetaminophen (TYLENOL) 500 MG tablet Take 1 tablet (500 mg total) by mouth 2 (two) times daily. Take one tablet twice daily to reduce back pain      . Biotin 5000 MCG CAPS Take 1 capsule by mouth daily.       Marland Kitchen BYSTOLIC 10 MG tablet TAKE  ONE TABLET BY MOUTH DAILY TO TREAT HIGH BLOOD PRESSURE  90 tablet  2  . meclizine (ANTIVERT) 25 MG tablet Take 25 mg by mouth. Take one up to 4 times daily as needed for dizziness      . Menthol-Zinc Oxide 0.44-20.625 % OINT Apply 1 application topically daily. Apply small amount to irritated skin daily after shower      . NIFEdipine (PROCARDIA-XL/ADALAT CC) 30 MG 24 hr tablet Take 30 mg by mouth every morning.       . Vitamin D, Ergocalciferol, (DRISDOL) 50000 UNITS CAPS capsule TAKE  ONE CAPSULE BY MOUTH EVERY 2 WEEKS  6 capsule  1  . [DISCONTINUED] pantoprazole (PROTONIX) 40 MG tablet Take 1 tablet (40 mg total) by mouth daily.  30 tablet  5   No current facility-administered medications on file prior to visit.    Review of Systems  Constitutional: Positive for appetite change. Negative for fever, chills, diaphoresis, activity change and fatigue.  HENT: Negative for congestion, dental problem, ear pain, hearing loss, sinus pressure and sore throat.   Eyes: Negative.   Respiratory: Negative.   Cardiovascular: Negative for chest pain, palpitations and leg swelling.  Gastrointestinal: Positive for nausea and constipation. Negative for vomiting and  abdominal distention.       Reflux   Endocrine: Negative.   Genitourinary: Negative.   Musculoskeletal: Negative.   Skin: Negative.   Allergic/Immunologic: Negative.   Neurological: Negative for dizziness, tremors, seizures, speech difficulty, weakness, light-headedness, numbness and headaches.  Hematological: Negative.   Psychiatric/Behavioral: Negative for behavioral problems, confusion, sleep disturbance, self-injury and agitation. The patient is not nervous/anxious.        Objective:   Physical Exam  Constitutional: She is oriented to person, place, and time. She appears well-developed. No distress.  HENT:  Head: Normocephalic and atraumatic.  Right Ear: External ear normal.  Left Ear: External ear normal.  Nose: Nose normal.  Mouth/Throat: Oropharynx is clear and moist.  Eyes: Conjunctivae and EOM are normal. Pupils are equal, round, and reactive to light.  Neck: No JVD present. No tracheal deviation present. No thyromegaly present.  Cardiovascular: Normal rate, regular rhythm, normal heart sounds and intact distal pulses.  Exam reveals no gallop and no friction rub.   No murmur heard. Pulmonary/Chest: No respiratory distress. She has no wheezes. She has no rales. She exhibits no tenderness.  Abdominal: She exhibits no distension and no mass. There is no tenderness.  Musculoskeletal: She exhibits no edema and no tenderness.  Lymphadenopathy:    She has no cervical adenopathy.  Neurological: She is alert and oriented to person, place, and time. No  cranial nerve deficit. Coordination normal.  Skin: No rash noted. No erythema. No pallor.  Psychiatric: She has a normal mood and affect. Her behavior is normal. Thought content normal.          Assessment & Plan:  GERD (gastroesophageal reflux disease): resume pantoprazole 40 mg qd. If she does not have a good relief of symptoms, consider Gastric Emptying Study.  HTN (hypertension): Mild elevation of systolic blood pressure.  No change in medications.  Tubulovillous adenoma of rectum: Patient is scheduled to see Dr. Abbey Chatters, general surgeon.  Abnormal blood sugar: Followup lab

## 2013-08-21 MED ORDER — PANTOPRAZOLE SODIUM 40 MG PO TBEC: DELAYED_RELEASE_TABLET | ORAL | Status: DC

## 2013-08-21 NOTE — Patient Instructions (Signed)
Stop ranitidine and resume taking pantoprazole.

## 2013-08-22 ENCOUNTER — Telehealth: Payer: Self-pay | Admitting: Internal Medicine

## 2013-08-22 ENCOUNTER — Encounter: Payer: Self-pay | Admitting: Gastroenterology

## 2013-08-22 ENCOUNTER — Ambulatory Visit (INDEPENDENT_AMBULATORY_CARE_PROVIDER_SITE_OTHER): Payer: Medicare Other | Admitting: Gastroenterology

## 2013-08-22 ENCOUNTER — Other Ambulatory Visit (INDEPENDENT_AMBULATORY_CARE_PROVIDER_SITE_OTHER): Payer: Medicare Other

## 2013-08-22 VITALS — BP 128/78 | HR 84 | Ht 59.5 in | Wt 151.2 lb

## 2013-08-22 DIAGNOSIS — R142 Eructation: Secondary | ICD-10-CM | POA: Insufficient documentation

## 2013-08-22 DIAGNOSIS — R110 Nausea: Secondary | ICD-10-CM

## 2013-08-22 DIAGNOSIS — R141 Gas pain: Secondary | ICD-10-CM

## 2013-08-22 DIAGNOSIS — R1013 Epigastric pain: Secondary | ICD-10-CM | POA: Insufficient documentation

## 2013-08-22 LAB — COMPREHENSIVE METABOLIC PANEL
Alkaline Phosphatase: 86 U/L (ref 39–117)
Glucose, Bld: 85 mg/dL (ref 70–99)
Sodium: 133 mEq/L — ABNORMAL LOW (ref 135–145)
Total Bilirubin: 0.6 mg/dL (ref 0.3–1.2)
Total Protein: 6.5 g/dL (ref 6.0–8.3)

## 2013-08-22 LAB — LIPASE: Lipase: 38 U/L (ref 11.0–59.0)

## 2013-08-22 LAB — CBC WITH DIFFERENTIAL/PLATELET
Basophils Absolute: 0 10*3/uL (ref 0.0–0.1)
Eosinophils Absolute: 0.2 10*3/uL (ref 0.0–0.7)
MCHC: 33.2 g/dL (ref 30.0–36.0)
MCV: 91.1 fl (ref 78.0–100.0)
Monocytes Absolute: 1.3 10*3/uL — ABNORMAL HIGH (ref 0.1–1.0)
Neutrophils Relative %: 63 % (ref 43.0–77.0)
Platelets: 310 10*3/uL (ref 150.0–400.0)
RDW: 12.3 % (ref 11.5–14.6)

## 2013-08-22 NOTE — Telephone Encounter (Signed)
Spoke with [facility] who states pt is crying and states her stomach. Huts, she has seen Dr Chilton Si and she has tried Zantac w/o help. Pt will see [name], PA this pm.

## 2013-08-22 NOTE — Patient Instructions (Addendum)
You have been scheduled for a CT scan of the abdomen and pelvis at [facility] ([address]---this is in the same building as [facility]).   You are scheduled on 08/26/13 at 10:30am. You should arrive 15 minutes prior to your appointment time for registration. Please follow the written instructions below on the day of your exam:  WARNING: IF YOU ARE ALLERGIC TO IODINE/X-RAY DYE, PLEASE NOTIFY RADIOLOGY IMMEDIATELY AT [phone]! YOU WILL BE GIVEN A 13 HOUR PREMEDICATION PREP.  1) Do not eat or drink anything after 6:30am (4 hours prior to your test) 2) You have been given 2 bottles of oral contrast to drink. The solution may taste better if refrigerated, but do NOT add ice or any other liquid to this solution. Shake well before drinking.    Drink 1 bottle of contrast @ 8:30am (2 hours prior to your exam)  Drink 1 bottle of contrast @ 9:30am (1 hour prior to your exam)  You may take any medications as prescribed with a small amount of water except for the following: Metformin, Glucophage, Glucovance, Avandamet, Riomet, Fortamet, Actoplus Met, Janumet, Glumetza or Metaglip. The above medications must be held the day of the exam AND 48 hours after the exam.  The purpose of you drinking the oral contrast is to aid in the visualization of your intestinal tract. The contrast solution may cause some diarrhea. Before your exam is started, you will be given a small amount of fluid to drink. Depending on your individual set of symptoms, you may also receive an intravenous injection of x-ray contrast/dye. Plan on being at [facility] for 30 minutes or long, depending on the type of exam you are having performed.  This test typically takes 30-45 minutes to complete.  If you have any questions regarding your exam or if you need to reschedule, you may call the CT department at [phone] between the hours of 8:00 am and 5:00 pm,  Monday-Friday.  ________________________________________________________________________   ________________________________________________________________________  Your physician has requested that you go to the basement for the following lab work before leaving today: CBC, CMET, Lipase, H. Pylori stool test  Increase your generic Zantac to twice a day.  We have made you a follow up appointment with Dr. Rhea Belton for December 4th 2014 at 10:00am, arrive 9:45am.  I appreciate the opportunity to care for you.

## 2013-08-22 NOTE — Progress Notes (Addendum)
08/22/2013 Heather Parker [id number] December 11, 1921   History of Present Illness:  This is an 79 year old female who is known to Dr. Rhea Belton. She has seen him in the past for complaints of nausea, belching, and epigastric abdominal pain. She underwent an EGD in February 2013 at which time she was only found to have a small hiatal hernia. She was given instructions to take Zantac daily, which she was taking in the evening with some success. Then, 2 weeks ago, she began to have a lot of burping and indigestion again. It seems worse than before. She feels very full and nauseated. Has some discomfort in the epigastrium. Says that she cannot eat and does not feel hungry; her weight is down 4 pounds since April according to our scale. She saw a physician in her living facility and was given Protonix 40 mg daily. She took them for short time but then developed headache and dizziness, which she contributed to the medication and discontinued that just one day ago.  Current Medications, Allergies, Past Medical History, Past Surgical History, Family History and Social History were reviewed in [employer] record.   Physical Exam: BP 128/78  Pulse 84  Ht 4' 11.5" (1.511 m)  Wt 151 lb 4 oz (68.607 kg)  BMI 30.05 kg/m2 General: Well developed elderly female in no acute distress Head: Normocephalic and atraumatic Eyes:  Sclerae anicteric, conjunctiva pink  Ears: Normal auditory acuity Lungs: Clear throughout to auscultation Heart: Regular rate and rhythm Abdomen: Soft, non-distended.  BS present.  Epigastric TTP without R/R/G. Musculoskeletal: Symmetrical with no gross deformities  Extremities: No edema  Neurological: Alert oriented x 4, grossly nonfocal Psychological:  Alert and cooperative. Normal mood and affect  Assessment and Recommendations: -Nausea, belching, GERD, epigastric pain:  This all may very well be due to dyspepsia and possibly some aerophagia, but with persistence and  worsening symptoms we will rule out GI pathology. We'll schedule CT scan of the abdomen and pelvis with contrast. She will have labs including a recent CBC, CMP, lipase. We will check stool for H. pylori. She will restart her Zantac 300 mg and increase that to 2 times daily. I did offer her some Zofran for the nausea, but she declined at this time. She will return in followup to see Dr. Rhea Belton in 3-4 weeks.  Addendum: Reviewed and agree with initial management. Beverley Fiedler, MD

## 2013-08-24 ENCOUNTER — Other Ambulatory Visit: Payer: Medicare Other

## 2013-08-24 DIAGNOSIS — R1013 Epigastric pain: Secondary | ICD-10-CM

## 2013-08-24 DIAGNOSIS — R110 Nausea: Secondary | ICD-10-CM

## 2013-08-25 LAB — HELICOBACTER PYLORI  SPECIAL ANTIGEN: H. PYLORI Antigen: NEGATIVE

## 2013-08-26 ENCOUNTER — Telehealth: Payer: Self-pay | Admitting: Internal Medicine

## 2013-08-26 ENCOUNTER — Ambulatory Visit (INDEPENDENT_AMBULATORY_CARE_PROVIDER_SITE_OTHER)
Admission: RE | Admit: 2013-08-26 | Discharge: 2013-08-26 | Disposition: A | Discharge status: Home / Self Care | Payer: Medicare Other | Source: Ambulatory Visit | Attending: Gastroenterology | Admitting: Gastroenterology

## 2013-08-26 DIAGNOSIS — R1013 Epigastric pain: Secondary | ICD-10-CM

## 2013-08-26 DIAGNOSIS — R110 Nausea: Secondary | ICD-10-CM

## 2013-08-26 MED ORDER — IOHEXOL 300 MG/ML  SOLN
100.0000 mL | Freq: Once | INTRAMUSCULAR | Status: AC | PRN
  Administered 2013-08-26: 100 mL via INTRAVENOUS

## 2013-08-26 NOTE — Telephone Encounter (Signed)
lmom for pt to call back

## 2013-08-26 NOTE — Telephone Encounter (Signed)
Spoke with Dr Rhea Belton because pt called be back again. He instructed me to tell the pt spots were found on her liver and the CT did not reveal the origin; no cancers were seen anywhere else. D/T her high risk of Colon Polyps, the COLON  is the suspected area of concern. He asked that I schedule the pt one day next week with a PA and he can come in and see her hopefully, at that time. He also asks that she bring her daughter. Informed pt who stated understanding. She will come 08/29/13 at 3pm unless I call her that morning after Dr Rhea Belton looks at his schedule.

## 2013-08-26 NOTE — Telephone Encounter (Signed)
Informed pt Dr Rhea Belton is not here and I cannot interpret the results; is it OK if we call her Monday? Pt stated understanding.

## 2013-08-29 ENCOUNTER — Other Ambulatory Visit: Payer: Medicare Other

## 2013-08-29 ENCOUNTER — Encounter: Payer: Self-pay | Admitting: Physician Assistant

## 2013-08-29 ENCOUNTER — Ambulatory Visit (INDEPENDENT_AMBULATORY_CARE_PROVIDER_SITE_OTHER): Payer: Medicare Other | Admitting: Physician Assistant

## 2013-08-29 VITALS — BP 124/60 | HR 72 | Ht 59.5 in | Wt 150.0 lb

## 2013-08-29 DIAGNOSIS — R932 Abnormal findings on diagnostic imaging of liver and biliary tract: Secondary | ICD-10-CM

## 2013-08-29 DIAGNOSIS — C229 Malignant neoplasm of liver, not specified as primary or secondary: Secondary | ICD-10-CM

## 2013-08-29 DIAGNOSIS — R634 Abnormal weight loss: Secondary | ICD-10-CM

## 2013-08-29 DIAGNOSIS — G8929 Other chronic pain: Secondary | ICD-10-CM

## 2013-08-29 DIAGNOSIS — R110 Nausea: Secondary | ICD-10-CM

## 2013-08-29 DIAGNOSIS — R1011 Right upper quadrant pain: Secondary | ICD-10-CM

## 2013-08-29 MED ORDER — ONDANSETRON 4 MG PO TBDP: ORAL_TABLET | ORAL | Status: DC

## 2013-08-29 MED ORDER — MOVIPREP 100 G PO SOLR: 1.0000 | Freq: Once | ORAL | Status: DC

## 2013-08-29 NOTE — Progress Notes (Addendum)
Subjective:    Patient ID: Heather Parker, female    DOB: Dec 25, 1921, 79 y.o.   MRN: [id number]  HPI  Heather Parker is a very nice 79 year old female known to Dr. Rhea Belton with history of a large tubovillous adenoma of the rectum for which she underwent a transanal resection per Dr. Abbey Chatters in April of 2013.Marland Kitchen She had also had full colonoscopy prior to that procedure which had shown a large sessile polyp in the rectum and biopsies had proven a tubovillous adenoma.Marland KitchenUpper endoscopy in February 2013  was negative. She came into the office about a week ago with new complaints of nausea which has now been present over the past 3 weeks and is fairly constant, she has also had a significant decrease in her appetite and complaints of upper abdominal discomfort. She has a sense of indigestion as well despite taking Zantac 300 mg twice daily. She denies any dysphagia or odynophagia. She's not had any changes in her bowel habits nor any melena or hematochezia. She had labs done on 1110 which were unrevealing and then had CT scan of the abdomen and pelvis on 08/26/2013 which unfortunately shows multiple liver masses consistent with metastatic disease. The largest of these measured 2.9 x 3.3 cm. Primary tumor is not apparent. There is no adenopathy seen in the abdomen and no other abnormalities other than a 2 cm lesion in the left kidney. Patient was brought back into the opposite today to review the CT findings. Her daughter is concerned because she's really not been needing much at all and the patient complains of ongoing nausea. She has generally been reluctant to take medications because she is very sensitive to them.    Review of Systems  Constitutional: Positive for appetite change, fatigue and unexpected weight change.  HENT: Negative.   Eyes: Negative.   Respiratory: Negative.   Cardiovascular: Negative.   Gastrointestinal: Positive for nausea and abdominal pain.  Endocrine: Negative.   Genitourinary:  Negative.   Musculoskeletal: Negative.   Skin: Negative.   Allergic/Immunologic: Negative.   Neurological: Negative.   Hematological: Negative.   Psychiatric/Behavioral: Negative.    Outpatient Prescriptions Prior to Visit  Medication Sig Dispense Refill  . acetaminophen (TYLENOL) 500 MG tablet Take 1 tablet (500 mg total) by mouth 2 (two) times daily. Take one tablet twice daily to reduce back pain      . Biotin 5000 MCG CAPS Take 1 capsule by mouth daily.       Marland Kitchen BYSTOLIC 10 MG tablet TAKE  ONE TABLET BY MOUTH DAILY TO TREAT HIGH BLOOD PRESSURE  90 tablet  2  . meclizine (ANTIVERT) 25 MG tablet Take 25 mg by mouth. Take one up to 4 times daily as needed for dizziness      . NIFEdipine (PROCARDIA-XL/ADALAT CC) 30 MG 24 hr tablet Take 30 mg by mouth every morning.       . ranitidine (ZANTAC) 300 MG tablet Take 300 mg by mouth 2 (two) times daily.      . Vitamin D, Ergocalciferol, (DRISDOL) 50000 UNITS CAPS capsule TAKE  ONE CAPSULE BY MOUTH EVERY 2 WEEKS  6 capsule  1   No facility-administered medications prior to visit.   Allergies  Allergen Reactions  . Codeine Nausea And Vomiting  . Darvocet [Propoxyphene-Acetaminophen] Other (See Comments)    Caused her to pass out  . Darvon Other (See Comments)    Pt unsure of reaction  . Protonix [Pantoprazole] Other (See Comments)    Dizziness and headaches  Patient Active Problem List   Diagnosis Date Noted  . Nausea alone 08/22/2013  . Abdominal pain, epigastric 08/22/2013  . Belching 08/22/2013  . Abnormal blood sugar 06/01/2013  . Xerosis cutis 06/01/2013  . Rash and nonspecific skin eruption 03/02/2013  . Tubulovillous adenoma of rectum 12/19/2011  . Hx of adenomatous colonic polyps 11/03/2011  . GERD (gastroesophageal reflux disease) 11/03/2011  . Degenerative disc disease 11/03/2011  . HTN (hypertension) 11/03/2011  . Osteoarthritis 11/03/2011  . Hemorrhoids 11/03/2011   History  Substance Use Topics  . Smoking status:  Never Smoker   . Smokeless tobacco: Never Used  . Alcohol Use: No   family history includes Heart disease in her father; Lung cancer in her brother; Stomach cancer in her sister; Stroke in her father. There is no history of Colon cancer.    Objective:   Physical Exam    Well-developed elderly white female in no acute distress, accompanied by her daughter ambulates with a walker blood pressure 124/60 pulse 72 height 4 foot 11 weight 150. HEENT; nontraumatic normocephalic EOMI PERRLA sclera anicteric, Supple; no JVD, Cardiovascular; regular rate and rhythm with S1-S2 no murmur or gallop pulmonary clear bilaterally, Abdomen; soft no focal tenderness no guarding or rebound no palpable mass or hepatosplenomegaly bowel sounds are present, Rectal ;exam no external lesions noted on digital exam, no rectal lesion felt- stool is Hemoccult negative, Extremities; no clubbing cyanosis or edema skin warm and dry, Psych; mood and affect normal and appropriate       Assessment & Plan:  #16  79 year old female with 3 week history of nausea and upper abdominal discomfort and CT abdomen showing multiple liver masses consistent with metastatic disease. Primary is not apparent.  #2 2 cm left upper pole renal lesion #3 history of large tubovillous adenoma of the rectum status post transanal resection April 2013   Plan; long discussion with the patient and her daughter today regarding CT findings and outlining plan for workup of hepatic metastases of on clear primary. Patient has generally been in fairly good health and would like to proceed with workup. She is undecided whether she would have any treatment.  Will start Zofran 4 mg every 6 hours as needed for nausea Soft bland diet and she's encouraged to try and sure boost in between meals Continue Zantac 300 mg by mouth twice daily Check CEA, CA 19-9, and alpha-fetoprotein level Schedule for CT scan of the chest Scheduled for MRI of the left kidney  If these  are unrevealing we'll proceed with colonoscopy and EGD with Dr. Rhea Belton which have also been scheduled today.   Addendum: Reviewed and agree with initial management. Plan discussed in person with patient and daughter who are in agreement with the current plan Beverley Fiedler, MD

## 2013-08-29 NOTE — Patient Instructions (Addendum)
Please go to the basement level to have your labs drawn.  CT Chest has been ordered. You have been scheduled for a CT scan of the chest at [facility] CT ([address]---this is in the same building as Architectural technologist).   You are scheduled on Thursday 09-01-2013 at 11 :00 am . You should arrive at 10:45 am.  prior to your appointment time for registration. Please follow the written instructions below on the day of your exam:  WARNING: IF YOU ARE ALLERGIC TO IODINE/X-RAY DYE, PLEASE NOTIFY RADIOLOGY IMMEDIATELY AT [phone]! YOU WILL BE GIVEN A 13 HOUR PREMEDICATION PREP.  1) Do not eat or drink anything after 7:00 am (4 hours prior to your test)  We have ordered an MR Abdomen at [facility] for Monday 09-05-2013 at 5:00 PM.  Arrive at 4:45 PM.  Have nothing by mouth after 1:00 PM.   We sent a prescription to [facility],  for Zofran.

## 2013-08-30 LAB — CEA: CEA: 2.1 ng/mL (ref 0.0–5.0)

## 2013-08-30 LAB — CANCER ANTIGEN 19-9: CA 19-9: 35.4 U/mL — ABNORMAL HIGH (ref <35.0)

## 2013-08-31 ENCOUNTER — Encounter: Payer: Self-pay | Admitting: Geriatric Medicine

## 2013-08-31 ENCOUNTER — Non-Acute Institutional Stay: Payer: Medicare Other | Admitting: Geriatric Medicine

## 2013-08-31 VITALS — BP 118/76 | HR 88 | Ht 59.5 in | Wt 149.0 lb

## 2013-08-31 DIAGNOSIS — I10 Essential (primary) hypertension: Secondary | ICD-10-CM

## 2013-08-31 DIAGNOSIS — R932 Abnormal findings on diagnostic imaging of liver and biliary tract: Secondary | ICD-10-CM | POA: Insufficient documentation

## 2013-08-31 DIAGNOSIS — R7309 Other abnormal glucose: Secondary | ICD-10-CM

## 2013-08-31 NOTE — Assessment & Plan Note (Signed)
A1c satisfactory, no diabetes

## 2013-08-31 NOTE — Progress Notes (Signed)
Patient ID: Heather Parker, female   DOB: 02-Apr-1922, 79 y.o.   MRN: [id number] [facility] [phone])  Code Status: Full Code      Contact Information   Name Relation Home Work Mobile   [place] Daughter [phone]        Chief Complaint  Patient presents with  . Medical Managment of Chronic Issues    blood pressure, blood sugar    HPI: This is a 79 y.o. female resident of [facility] Independent Living section evaluated today for management of ongoing medical issues.   Last visit:  Degenerative disc disease Chronic back pain after fall/ lumbar fracture 2011. MRI result from 5/ 2011 revealed multilevel DDD,  had a series of 3 back injections in 2012 that were not effective.  Back pain continues to affect this patient daily, she is able to maintain daily exercise however standing and walking is quite painful for her. B.i.d. dosing of Tylenol reduces the pain somewhat. She is interested in considering spinal injections if that would be helpful. She is not interested in pursuing surgical intervention. Patient is returning to Dr. Ranell Patrick next week for reevaluation of her osteoarthritis in her knee, will defer consideration of  back injection recommendation to him, will send copy of MRI from 2011 with patient.  Osteoarthritis Patient had injection of her right knee by Dr. Ranell Patrick, reports she had very good relief pain. Pain has returned, she has an appointment with Dr. Ranell Patrick next week for another injection  Abnormal blood sugar Fasting blood sugar continues to remain mildly elevated. Will obtain hemoglobin A1c for wider picture of her blood sugar levels. Encouraged exercise as she tolerates and efforts at mild weight loss.  HTN (hypertension) Blood pressure remains well-controlled on current medication, recent lab studies satisfactory.  Since last visit, patient developed very bad nausea. Was evaluated by GI with a CT scan of the  abdomen. This revealed liver metastases, no primary tumor was identified. Patient has followed up with Dr. Rhea Belton who has recommended further investigation with  CT scan of the chest,  MR of the abdomen and pelvis to identify primary tumor. In the meantime Zofran is helping with the nausea. Patient reports she is eating a little bit now. Continues to have discomfort in her upper abdomen, no other pain. She is agreeable to proceeding with investigations but is not sure she wants to pursue treatment.  Blood pressure remains well-controlled. Mild elevation in blood sugar was followed up with A1c which was in normal range. The patient's back pain is unchanged, she did not pursue further back injections. She has had an injection in her left knee which has been helpful.   Allergies  Allergen Reactions  . Codeine Nausea And Vomiting  . Darvocet [Propoxyphene-Acetaminophen] Other (See Comments)    Caused her to pass out  . Darvon Other (See Comments)    Pt unsure of reaction  . Protonix [Pantoprazole] Other (See Comments)    Dizziness and headaches     Medication List       This list is accurate as of: 08/31/13  9:22 AM.  Always use your most recent med list.               acetaminophen 500 MG tablet  Commonly known as:  TYLENOL  Take 1 tablet (500 mg total) by mouth 2 (two) times daily. Take one tablet twice daily to reduce back pain     Biotin 5000 MCG Caps  Take 1  capsule by mouth daily.     BYSTOLIC 10 MG tablet  Generic drug:  nebivolol  TAKE  ONE TABLET BY MOUTH DAILY TO TREAT HIGH BLOOD PRESSURE     meclizine 25 MG tablet  Commonly known as:  ANTIVERT  Take 25 mg by mouth. Take one up to 4 times daily as needed for dizziness     NIFEdipine 30 MG 24 hr tablet  Commonly known as:  PROCARDIA-XL/ADALAT CC  Take 30 mg by mouth every morning.     ondansetron 4 MG disintegrating tablet  Commonly known as:  ZOFRAN ODT  Dissolve tab on the tongue every 6 hours as needed for  nausea.     ranitidine 300 MG tablet  Commonly known as:  ZANTAC  Take 300 mg by mouth 2 (two) times daily.     Vitamin D (Ergocalciferol) 50000 UNITS Caps capsule  Commonly known as:  DRISDOL  TAKE  ONE CAPSULE BY MOUTH EVERY 2 WEEKS        Data Reviewed       ZOX:WRUEAVW, external 05/25/2012 BMP: Sodium 137, Potassium 4.8, glucose 110, BUN 18, Creatinine 1.04 TSH 2.096 10/26/2012 CBC: Wbc 9.5, Rbc 4.56, Hgb 13.6, Hct 40.3, Platelet 317 CMP: Sodium 139, Potassium 4.9, glucose 115, BUN 21, Creatinine 1.10 Lipid: cholesterol 188, triglyceride 90, HDL 50, LDL 120  05/26/2013 glucose 115, BUN 19, creatinine 1.06, sodium 139, potassium 4.8. Protein/LFTs WNL.  TSH 2.201 06/02/2013 A1c 6.0     Review of Systems  DATA OBTAINED: from patient GENERAL: Feels well No fevers, fatigue, change in appetite or weight SKIN: Skin is itchy, dry. No rash or open wounds.  EYES: No eye pain, dryness or itching  No change in vision EARS: No earache, tinnitus, change in hearing NOSE: No congestion, drainage or bleeding MOUTH/THROAT: No mouth or tooth pain  No difficulty chewing or swallowing RESPIRATORY: No cough, wheezing, SOB CARDIAC: No chest pain, palpitations  No edema. GI: Upper abdominal discomfort, fullness. Nausea controlled with Zofran today. No V/D or constipation  Denies Occ. heartburn or reflux   Fecal seepage /anal skin irritation GU: No dysuria, frequency or urgency  No change in urine volume or character No nocturia or change in stream   MUSCULOSKELETAL:   Back pain, unchanged.  Rt. Knee pain No muscle ache, pain, weakness  Gait is steady  No recent falls.  NEUROLOGIC: No dizziness, fainting, headache  No change in mental status.  PSYCHIATRIC: No feelings of anxiety, depression Sleeps well.     Physical Exam Filed Vitals:   08/31/13 0914  BP: 118/76  Pulse: 88  Height: 4' 11.5" (1.511 m)  Weight: 149 lb (67.586 kg)   Body mass index is 29.6 kg/(m^2).  GENERAL APPEARANCE: No  acute distress, appropriately groomed, overweight body habitus,. Alert, pleasant, less conversant than usual. SKIN: No rash or unusual lesions. There is evidence of long-term skin damage. HEAD: Normocephalic, atraumatic EYES: Conjunctiva/lids clear.  EARS:  Hearing grossly normal. NOSE: No deformity or discharge. MOUTH/THROAT: Lips w/o lesions. Oral mucosa, tongue moist, w/o lesion. Oropharynx w/o redness or lesions.  NECK: Supple, full ROM. No thyroid tenderness, enlargement or nodule LYMPHATICS: No head, neck or supraclavicular adenopathy RESPIRATORY: Breathing is even, unlabored. Lung sounds are clear and full.  CARDIOVASCULAR: Heart RRR. No murmur or extra heart sounds  EDEMA: No peripheral  edema. MUSCULOSKELETAL: Moves all extremities with full ROM, strength and tone. Back is without kyphosis, scoliosis or spinal process tenderness. Gait is steady NEUROLOGIC: Oriented to time, place,  person. Speech clear, no tremor.  PSYCHIATRIC: Depressed mood and affect.  ASSESSMENT/PLAN  Abnormal CT of liver Multiple liver masses consistent with metastasis. Further investigations have been scheduled by Dr. Rhea Belton, patient will followup with him in early December.  Patient is interested diagnosing this problem, not sure she will pursue treatment. Will be available to patient as needed  Abnormal blood sugar A1c satisfactory, no diabetes  HTN (hypertension) Blood pressure remains well-controlled on current medications   Follow up: 12/2013 Dr. Annamary Carolin T.Junice Fei, NP-C 08/31/2013

## 2013-08-31 NOTE — Assessment & Plan Note (Addendum)
Multiple liver masses consistent with metastasis. Further investigations have been scheduled by Dr. Rhea Belton, patient will followup with him in early December.  Patient is interested diagnosing this problem, not sure she will pursue treatment. Will be available to patient as needed

## 2013-08-31 NOTE — Assessment & Plan Note (Signed)
Blood pressure remains well controlled on current medications.

## 2013-09-01 ENCOUNTER — Ambulatory Visit (INDEPENDENT_AMBULATORY_CARE_PROVIDER_SITE_OTHER)
Admission: RE | Admit: 2013-09-01 | Discharge: 2013-09-01 | Disposition: A | Discharge status: Home / Self Care | Payer: Medicare Other | Source: Ambulatory Visit | Attending: Physician Assistant | Admitting: Physician Assistant

## 2013-09-01 DIAGNOSIS — C229 Malignant neoplasm of liver, not specified as primary or secondary: Secondary | ICD-10-CM

## 2013-09-01 DIAGNOSIS — R932 Abnormal findings on diagnostic imaging of liver and biliary tract: Secondary | ICD-10-CM

## 2013-09-01 MED ORDER — IOHEXOL 300 MG/ML  SOLN
80.0000 mL | Freq: Once | INTRAMUSCULAR | Status: AC | PRN
  Administered 2013-09-01: 80 mL via INTRAVENOUS

## 2013-09-02 ENCOUNTER — Telehealth: Payer: Self-pay | Admitting: Internal Medicine

## 2013-09-02 NOTE — Telephone Encounter (Signed)
Notes Recorded by Sammuel Cooper, PA-C on 09/02/2013 at 9:36 AM Please let pt and daughter know the CT of chest does nopt show any findings concerning for cancer- she is also scheduled for an MRI of abd/kdney  Pt stated understanding with the results, but questions what is wrong with her. She states it hurts to take a deep breath for the past 3-4 days and she stays nauseated. Any advise? Thanks.

## 2013-09-02 NOTE — Telephone Encounter (Signed)
lmom for pt to call back

## 2013-09-02 NOTE — Telephone Encounter (Signed)
Pt called back and states she has been sleeping. She states she took a nausea pill and that knocks her out. She reports she feels much better now. I asked about thechest pain and she states it's better as well; states it really scared her. Expressed understanding, but asked her to go to the ER the next time it happens it could be d/t several things like a pulmonary embolus, MI, ETC. Pt stated understanding.

## 2013-09-02 NOTE — Telephone Encounter (Signed)
Call patient per Amy Determine pulmonary symptoms more adequately decide if ER evaluation needed

## 2013-09-05 ENCOUNTER — Ambulatory Visit (HOSPITAL_COMMUNITY)
Admission: RE | Admit: 2013-09-05 | Discharge: 2013-09-05 | Disposition: A | Discharge status: Home / Self Care | Payer: Medicare Other | Source: Ambulatory Visit | Attending: Physician Assistant | Admitting: Physician Assistant

## 2013-09-05 ENCOUNTER — Other Ambulatory Visit: Payer: Self-pay | Admitting: Physician Assistant

## 2013-09-05 DIAGNOSIS — K7689 Other specified diseases of liver: Secondary | ICD-10-CM | POA: Insufficient documentation

## 2013-09-05 DIAGNOSIS — C801 Malignant (primary) neoplasm, unspecified: Secondary | ICD-10-CM | POA: Insufficient documentation

## 2013-09-05 DIAGNOSIS — M51379 Other intervertebral disc degeneration, lumbosacral region without mention of lumbar back pain or lower extremity pain: Secondary | ICD-10-CM | POA: Insufficient documentation

## 2013-09-05 DIAGNOSIS — R932 Abnormal findings on diagnostic imaging of liver and biliary tract: Secondary | ICD-10-CM

## 2013-09-05 DIAGNOSIS — N281 Cyst of kidney, acquired: Secondary | ICD-10-CM | POA: Insufficient documentation

## 2013-09-05 DIAGNOSIS — K5730 Diverticulosis of large intestine without perforation or abscess without bleeding: Secondary | ICD-10-CM | POA: Insufficient documentation

## 2013-09-05 DIAGNOSIS — G8929 Other chronic pain: Secondary | ICD-10-CM

## 2013-09-05 DIAGNOSIS — M8448XA Pathological fracture, other site, initial encounter for fracture: Secondary | ICD-10-CM | POA: Insufficient documentation

## 2013-09-05 DIAGNOSIS — C787 Secondary malignant neoplasm of liver and intrahepatic bile duct: Secondary | ICD-10-CM | POA: Insufficient documentation

## 2013-09-05 DIAGNOSIS — R634 Abnormal weight loss: Secondary | ICD-10-CM

## 2013-09-05 DIAGNOSIS — M47817 Spondylosis without myelopathy or radiculopathy, lumbosacral region: Secondary | ICD-10-CM | POA: Insufficient documentation

## 2013-09-05 DIAGNOSIS — R110 Nausea: Secondary | ICD-10-CM

## 2013-09-05 DIAGNOSIS — C229 Malignant neoplasm of liver, not specified as primary or secondary: Secondary | ICD-10-CM

## 2013-09-05 DIAGNOSIS — K869 Disease of pancreas, unspecified: Secondary | ICD-10-CM | POA: Insufficient documentation

## 2013-09-05 DIAGNOSIS — M5137 Other intervertebral disc degeneration, lumbosacral region: Secondary | ICD-10-CM | POA: Insufficient documentation

## 2013-09-05 DIAGNOSIS — D3500 Benign neoplasm of unspecified adrenal gland: Secondary | ICD-10-CM | POA: Insufficient documentation

## 2013-09-05 MED ORDER — GADOBENATE DIMEGLUMINE 529 MG/ML IV SOLN
14.0000 mL | Freq: Once | INTRAVENOUS | Status: AC | PRN
  Administered 2013-09-05: 14 mL via INTRAVENOUS

## 2013-09-07 ENCOUNTER — Telehealth: Payer: Self-pay | Admitting: *Deleted

## 2013-09-07 ENCOUNTER — Other Ambulatory Visit (HOSPITAL_COMMUNITY): Payer: Self-pay | Admitting: Radiology

## 2013-09-07 ENCOUNTER — Other Ambulatory Visit: Payer: Self-pay | Admitting: Radiology

## 2013-09-07 DIAGNOSIS — K769 Liver disease, unspecified: Secondary | ICD-10-CM

## 2013-09-07 NOTE — Telephone Encounter (Signed)
Called [facility] to schedule a Liver Bx for pt. The request will be sent for review by IR Physicians. Informed pt that her Liver Bx has been requested. They will call her or I will call her with the appt; pt stated understanding.

## 2013-09-09 ENCOUNTER — Encounter (HOSPITAL_COMMUNITY): Payer: Self-pay

## 2013-09-09 ENCOUNTER — Ambulatory Visit (HOSPITAL_COMMUNITY)
Admission: RE | Admit: 2013-09-09 | Discharge: 2013-09-09 | Disposition: A | Discharge status: Home / Self Care | Payer: Medicare Other | Source: Ambulatory Visit | Attending: Internal Medicine | Admitting: Internal Medicine

## 2013-09-09 DIAGNOSIS — K219 Gastro-esophageal reflux disease without esophagitis: Secondary | ICD-10-CM | POA: Insufficient documentation

## 2013-09-09 DIAGNOSIS — Z79899 Other long term (current) drug therapy: Secondary | ICD-10-CM | POA: Insufficient documentation

## 2013-09-09 DIAGNOSIS — C7A098 Malignant carcinoid tumors of other sites: Secondary | ICD-10-CM | POA: Insufficient documentation

## 2013-09-09 DIAGNOSIS — Z01812 Encounter for preprocedural laboratory examination: Secondary | ICD-10-CM | POA: Insufficient documentation

## 2013-09-09 DIAGNOSIS — K769 Liver disease, unspecified: Secondary | ICD-10-CM

## 2013-09-09 DIAGNOSIS — N189 Chronic kidney disease, unspecified: Secondary | ICD-10-CM | POA: Insufficient documentation

## 2013-09-09 DIAGNOSIS — I129 Hypertensive chronic kidney disease with stage 1 through stage 4 chronic kidney disease, or unspecified chronic kidney disease: Secondary | ICD-10-CM | POA: Insufficient documentation

## 2013-09-09 LAB — CBC
Hemoglobin: 13.8 g/dL (ref 12.0–15.0)
MCH: 30.7 pg (ref 26.0–34.0)
MCV: 89.6 fL (ref 78.0–100.0)
Platelets: 318 10*3/uL (ref 150–400)
RDW: 12.2 % (ref 11.5–15.5)
WBC: 11 10*3/uL — ABNORMAL HIGH (ref 4.0–10.5)

## 2013-09-09 LAB — PROTIME-INR: Prothrombin Time: 14.3 seconds (ref 11.6–15.2)

## 2013-09-09 LAB — APTT: aPTT: 30 seconds (ref 24–37)

## 2013-09-09 MED ORDER — MIDAZOLAM HCL 2 MG/2ML IJ SOLN
INTRAMUSCULAR | Status: AC | PRN
  Administered 2013-09-09 (×2): 0.5 mg via INTRAVENOUS

## 2013-09-09 MED ORDER — SODIUM CHLORIDE 0.9 % IV SOLN
INTRAVENOUS | Status: DC
  Administered 2013-09-09: 14:00:00 via INTRAVENOUS

## 2013-09-09 MED ORDER — MIDAZOLAM HCL 2 MG/2ML IJ SOLN
INTRAMUSCULAR | Status: AC
  Filled 2013-09-09: qty 4

## 2013-09-09 MED ORDER — FENTANYL CITRATE 0.05 MG/ML IJ SOLN
INTRAMUSCULAR | Status: AC
  Filled 2013-09-09: qty 4

## 2013-09-09 MED ORDER — FENTANYL CITRATE 0.05 MG/ML IJ SOLN
INTRAMUSCULAR | Status: AC | PRN
  Administered 2013-09-09: 12.5 ug via INTRAVENOUS
  Administered 2013-09-09: 25 ug via INTRAVENOUS

## 2013-09-09 NOTE — Progress Notes (Signed)
C/O CHEST SORENESS AND DR WATTS NOTIFIED; CLIENT STATES FEELS LIKE INDIGESTION 6/10 AT FIRST AND HOB ELEVATED AND THEN STATES 3/10 SORENESS

## 2013-09-09 NOTE — ED Notes (Signed)
o2 d/c'd 

## 2013-09-09 NOTE — Discharge Instructions (Signed)
Liver Biopsy °Care After °Refer to this sheet in the next few weeks. These discharge instructions provide you with general information on caring for yourself after you leave the hospital. Your caregiver may also give you specific instructions. Your treatment has been planned according to the most current medical practices available, but unavoidable complications sometimes occur. If you have any problems or questions after discharge, please call your caregiver. °HOME CARE INSTRUCTIONS  °· You should rest for 1 to 2 days or as instructed. °· If you go home the same day as your procedure (outpatient), have a responsible adult take you home and stay with you overnight. °· Do not lift more than 5 pounds or play contact sports for 2 weeks. °· Do not drive for 24 hours after this test. °· Do not take medicine containing aspirin or drink alcohol for 1 week after this test. °· Change bandages (dressings) as directed. °· Only take over-the-counter or prescription medicines for pain, discomfort, or fever as directed by your caregiver. °OBTAINING YOUR TEST RESULTS °Not all test results are available during your visit. If your test results are not back during the visit, make an appointment with your caregiver to find out the results. Do not assume everything is normal if you have not heard from your caregiver or the medical facility. It is important for you to follow up on all of your test results. °SEEK MEDICAL CARE IF:  °· You have increased bleeding (more than a small spot) from the biopsy site. °· You have redness, swelling, or increasing pain in the biopsy site. °· You have an oral temperature above 102° F (38.9° C). °SEEK IMMEDIATE MEDICAL CARE IF:  °· You develop swelling or pain in the belly (abdomen). °· You develop a rash. °· You have difficulty breathing, feel short of breath, or feel faint. °· You develop any reaction or side effects to medicines given. °MAKE SURE YOU:  °· Understand these instructions. °· Will watch  your condition. °· Will get help right away if you are not doing well or get worse. °Document Released: 04/18/2005 Document Revised: 12/22/2011 Document Reviewed: 05/11/2008 °[employer]® Patient Information ©2014 [employer]. ° °

## 2013-09-09 NOTE — H&P (Signed)
Chief Complaint: "I'm here for a liver biopsy" Referring Physician:Pyrtle HPI: Heather Parker is an 79 y.o. female who is found to have a pancreatic mass with liver lesions. She has a prior history of tubulovillous adenoma removed in 2013. She is now referred for biopsy of a liver lesion to obtain tissue diagnosis. PMHx and meds reviewed. Pt feels ok today. Has been NPO since 8:00am today. No blood thinner use.  Past Medical History:  Past Medical History  Diagnosis Date  . Unspecified essential hypertension   . Regional enteritis of unspecified site   . Hemorrhoids   . GERD (gastroesophageal reflux disease)   . Ruptured lumbar disc   . Adenomatous rectal polyp   . Arthritis   . Chronic kidney disease     hx of cystitis   . Sun-damaged skin 01/12/12    " Sun spots removed from face and left wrist, right hand frozen"  . Anal and rectal polyp 12/03/2011  . Pain in joint, lower leg 07/23/2011  . Abnormal feces 06/25/2011  . Dysphagia, oropharyngeal phase 05/14/2011  . Syncope and collapse 02/24/2011  . Dizziness and giddiness 02/24/2011  . Abnormality of gait 12/23/2010  . Fecal smearing 12/23/2010  . Closed fracture of vertebral column 11/26/2010  . Other and unspecified hyperlipidemia 11/21/2010  . Other abnormal blood chemistry 11/21/2010  . Lumbago 11/20/2010  . Rash and other nonspecific skin eruption 11/20/2010  . Alopecia, unspecified 12/22/2009  . Abnormal blood sugar 06/01/2013  . Xerosis cutis 06/01/2013    Past Surgical History:  Past Surgical History  Procedure Laterality Date  . Appendectomy    . Vaginal hysterectomy      still has ovaries  . Knee arthroscopy      Bi-lat  . Tonsillectomy and adenoidectomy    . Colonoscopy    . Polypectomy    . Other surgical history      lump removed right breast at age 67     Family History:  Family History  Problem Relation Age of Onset  . Stomach cancer Sister   . Lung cancer Brother   . Heart disease Father   . Stroke Father   .  Colon cancer Neg Hx     Social History:  reports that she has never smoked. She has never used smokeless tobacco. She reports that she does not drink alcohol or use illicit drugs.  Allergies:  Allergies  Allergen Reactions  . Codeine Nausea And Vomiting  . Darvocet [Propoxyphene-Acetaminophen] Other (See Comments)    Caused her to pass out  . Darvon Other (See Comments)    Pt unsure of reaction  . Protonix [Pantoprazole] Other (See Comments)    Dizziness and headaches    Medications:   Medication List    ASK your doctor about these medications       acetaminophen 500 MG tablet  Commonly known as:  TYLENOL  Take 1 tablet (500 mg total) by mouth 2 (two) times daily. Take one tablet twice daily to reduce back pain     Biotin 5000 MCG Caps  Take 1 capsule by mouth daily.     BYSTOLIC 10 MG tablet  Generic drug:  nebivolol  TAKE  ONE TABLET BY MOUTH DAILY TO TREAT HIGH BLOOD PRESSURE     meclizine 25 MG tablet  Commonly known as:  ANTIVERT  Take 25 mg by mouth. Take one up to 4 times daily as needed for dizziness     NIFEdipine 30 MG 24 hr tablet  Commonly known as:  PROCARDIA-XL/ADALAT CC  Take 30 mg by mouth every morning.     ondansetron 4 MG disintegrating tablet  Commonly known as:  ZOFRAN ODT  Dissolve tab on the tongue every 6 hours as needed for nausea.     ranitidine 300 MG tablet  Commonly known as:  ZANTAC  Take 300 mg by mouth at bedtime.     Vitamin D (Ergocalciferol) 50000 UNITS Caps capsule  Commonly known as:  DRISDOL  TAKE  ONE CAPSULE BY MOUTH EVERY 2 WEEKS        Please HPI for pertinent positives, otherwise complete 10 system ROS negative.  Physical Exam: BP 156/73  Pulse 79  Temp(Src) 97.9 F (36.6 C) (Oral)  Resp 16  SpO2 99% There is no weight on file to calculate BMI.   General Appearance:  Alert, cooperative, no distress, appears stated age  Head:  Normocephalic, without obvious abnormality, atraumatic  ENT: Unremarkable   Neck: Supple, symmetrical, trachea midline  Lungs:   Clear to auscultation bilaterally, no w/r/r, respirations unlabored without use of accessory muscles.  Chest Wall:  No tenderness or deformity  Heart:  Regular rate and rhythm, S1, S2 normal, no murmur, rub or gallop.  Abdomen:   Soft, non-tender, non distended.  Neurologic: Normal affect, no gross deficits.   Results for orders placed during the hospital encounter of 09/09/13 (from the past 48 hour(s))  APTT     Status: None   Collection Time    09/09/13  2:00 PM      Result Value Range   aPTT 30  24 - 37 seconds  CBC     Status: Abnormal   Collection Time    09/09/13  2:00 PM      Result Value Range   WBC 11.0 (*) 4.0 - 10.5 K/uL   RBC 4.50  3.87 - 5.11 MIL/uL   Hemoglobin 13.8  12.0 - 15.0 g/dL   HCT 82.9  56.2 - 13.0 %   MCV 89.6  78.0 - 100.0 fL   MCH 30.7  26.0 - 34.0 pg   MCHC 34.2  30.0 - 36.0 g/dL   RDW 86.5  78.4 - 69.6 %   Platelets 318  150 - 400 K/uL  PROTIME-INR     Status: None   Collection Time    09/09/13  2:00 PM      Result Value Range   Prothrombin Time 14.3  11.6 - 15.2 seconds   INR 1.13  0.00 - 1.49   No results found.  Assessment/Plan Pancreatic mass with liver lesions. Discussed US guided biopsy Explained procedure, risks, complications, use of sedation. Labs pending Consent signed in chart   Brayton El PA-C 09/09/2013, 2:30 PM

## 2013-09-09 NOTE — Procedures (Signed)
Technically successful US guided biopsy of lesion within the right lobe of the liver.  No immediate complications.  

## 2013-09-09 NOTE — ED Notes (Signed)
O2 starated 2 L/

## 2013-09-12 ENCOUNTER — Ambulatory Visit (HOSPITAL_COMMUNITY): Payer: Medicare Other

## 2013-09-13 ENCOUNTER — Telehealth: Payer: Self-pay | Admitting: *Deleted

## 2013-09-13 DIAGNOSIS — K769 Liver disease, unspecified: Secondary | ICD-10-CM

## 2013-09-13 DIAGNOSIS — Z85038 Personal history of other malignant neoplasm of large intestine: Secondary | ICD-10-CM

## 2013-09-13 NOTE — Telephone Encounter (Signed)
Message copied by Florene Glen on Tue Sep 13, 2013 10:40 AM ------      Message from: Beverley Fiedler      Created: Tue Sep 06, 2013  5:31 PM       Please schedule patient for ultrasound guided liver biopsy or diagnosis, schedule ASAP      Oncology referral to Dr. Truett Perna, schedule for after liver biopsy      Colonoscopy/EGD scheduled for later in December will likely be canceled. Did not cancel yet so that spots can be held should something change      I discussed the MRI results with patient and daughter by phone this evening. Time provided for questions and answers and they thanked me for the call      Again her daughter requested that the ultrasound biopsy be as soon as possible ------

## 2013-09-14 ENCOUNTER — Encounter: Payer: Self-pay | Admitting: Nutrition

## 2013-09-14 NOTE — Telephone Encounter (Signed)
Vicie Mutters, Nurse Navigator at the [facility] called to report pt is scheduled to see Dr Truett Perna at 1:30pm on 09/27/15. Our office will give her the appt. Dr Rhea Belton received a verbal on path today; metastatic neuroendocrine or carcinoid. Dr Rhea Belton is seeing pt in the am and we will inform her of the appt.

## 2013-09-15 ENCOUNTER — Ambulatory Visit (INDEPENDENT_AMBULATORY_CARE_PROVIDER_SITE_OTHER): Payer: Medicare Other | Admitting: Internal Medicine

## 2013-09-15 ENCOUNTER — Encounter: Payer: Self-pay | Admitting: Internal Medicine

## 2013-09-15 ENCOUNTER — Other Ambulatory Visit (INDEPENDENT_AMBULATORY_CARE_PROVIDER_SITE_OTHER): Payer: Medicare Other

## 2013-09-15 VITALS — BP 146/82 | HR 88 | Ht 60.0 in | Wt 148.0 lb

## 2013-09-15 DIAGNOSIS — K3189 Other diseases of stomach and duodenum: Secondary | ICD-10-CM

## 2013-09-15 DIAGNOSIS — R110 Nausea: Secondary | ICD-10-CM

## 2013-09-15 DIAGNOSIS — C7A00 Malignant carcinoid tumor of unspecified site: Secondary | ICD-10-CM

## 2013-09-15 DIAGNOSIS — C7B8 Other secondary neuroendocrine tumors: Secondary | ICD-10-CM

## 2013-09-15 DIAGNOSIS — R1013 Epigastric pain: Secondary | ICD-10-CM

## 2013-09-15 DIAGNOSIS — C787 Secondary malignant neoplasm of liver and intrahepatic bile duct: Secondary | ICD-10-CM

## 2013-09-15 DIAGNOSIS — K5900 Constipation, unspecified: Secondary | ICD-10-CM

## 2013-09-15 LAB — COMPREHENSIVE METABOLIC PANEL
AST: 28 U/L (ref 0–37)
Albumin: 3.6 g/dL (ref 3.5–5.2)
BUN: 15 mg/dL (ref 6–23)
Calcium: 9.4 mg/dL (ref 8.4–10.5)
Chloride: 102 mEq/L (ref 96–112)
Creatinine, Ser: 0.9 mg/dL (ref 0.4–1.2)
Glucose, Bld: 108 mg/dL — ABNORMAL HIGH (ref 70–99)
Potassium: 4.5 mEq/L (ref 3.5–5.1)
Total Bilirubin: 0.6 mg/dL (ref 0.3–1.2)

## 2013-09-15 MED ORDER — RANITIDINE HCL 300 MG PO TABS: 300.0000 mg | ORAL_TABLET | Freq: Every day | ORAL | Status: DC

## 2013-09-15 MED ORDER — POLYETHYLENE GLYCOL 3350 17 GM/SCOOP PO POWD: 1.0000 | Freq: Every day | ORAL | Status: DC

## 2013-09-15 MED ORDER — METOCLOPRAMIDE HCL 5 MG PO TABS: 5.0000 mg | ORAL_TABLET | Freq: Four times a day (QID) | ORAL | Status: DC

## 2013-09-15 NOTE — Patient Instructions (Signed)
Your physician has requested that you go to the basement for the following lab work before leaving today: gastrin, reglan. Miralax, I will call you with Dexilant samples Use Zantac as needed before bedtime and suppositories as needed.   We have cancelled your procedures.   liberalize your diet.                                                We are excited to introduce MyChart, a new best-in-class service that provides you online access to important information in your electronic medical record. We want to make it easier for you to view your health information - all in one secure location - when and where you need it. We expect MyChart will enhance the quality of care and service we provide.  When you register for MyChart, you can:    View your test results.    Request appointments and receive appointment reminders via email.    Request medication renewals.    View your medical history, allergies, medications and immunizations.    Communicate with your physician's office through a password-protected site.    Conveniently print information such as your medication lists.  To find out if MyChart is right for you, please talk to a member of our clinical staff today. We will gladly answer your questions about this free health and wellness tool.  If you are age 77 or older and want a member of your family to have access to your record, you must provide written consent by completing a proxy form available at our office. Please speak to our clinical staff about guidelines regarding accounts for patients younger than age 25.  As you activate your MyChart account and need any technical assistance, please call the MyChart technical support line at [phone]) or email your question to [email or web address]. If you email your question(s), please include your name, a return phone number and the best time to reach you.  If you have non-urgent health-related questions, you can  send a message to our office through MyChart at [email or web address]. If you have a medical emergency, call 911.  Thank you for using MyChart as your new health and wellness resource!   MyChart licensed from [employer],  [id number]. Patents Pending.

## 2013-09-15 NOTE — Progress Notes (Signed)
Subjective:    Patient ID: Heather Parker, female    DOB: [birth year], 79 y.o.   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 Initially she had a CT scan which showed multiple liver lesions concerning for a metastatic process. She also had a questionable lesion in her kidney. This led to other imaging including a chest CT and an MRI of her abdomen and pelvis. The MRI scan showed benign renal lesion but a concerning pancreatic tail lesion. Following this scan, an ultrasound-guided biopsy was arranged and performed last week. Pathology results returned yesterday, and she is here today with her daughter to discuss further.  She is feeling poorly. Her appetite has significantly reduced. She continues to have significant and severe indigestion and heartburn. Her nausea is also quite significant. She is not vomiting. She is trying to eat but very bland foods such as eggs and oatmeal. She is taking some Ensure.  She is taking ranitidine 300 mg twice daily and using oral disintegrating Zofran. The Zofran helps some but not a lot. The indigestion doesn't seem to respond to ranitidine much either. She feels slightly constipated. She denies diarrhea, no flushing or night sweats. No known fevers. She also feels down and depressed about the recent diagnosis. She does wish to return to water aerobics at her living facility.  Review of Systems As per history of present illness, otherwise negative  Current Medications, Allergies, Past Medical History, Past Surgical History, Family History and Social History were reviewed in [employer] record.     Objective:   Physical Exam BP 146/82  Pulse 88  Ht 5' (1.524 m)  Wt 148 lb (67.132  kg)  BMI 28.90 kg/m2 Constitutional: Elderly appearing female in no acute distress HEENT: Normocephalic and atraumatic.  No scleral icterus. Neurological: Alert and oriented to person place and time. Skin: Skin is warm and dry. No rashes noted. Psychiatric: Depressed mood today and somewhat flat affect with normal behavior  CBC    Component Value Date/Time   WBC 11.0* 09/09/2013 1400   RBC 4.50 09/09/2013 1400   HGB 13.8 09/09/2013 1400   HCT 40.3 09/09/2013 1400   PLT 318 09/09/2013 1400   MCV 89.6 09/09/2013 1400   MCH 30.7 09/09/2013 1400   MCHC 34.2 09/09/2013 1400   RDW 12.2 09/09/2013 1400   LYMPHSABS 2.5 08/22/2013 1511   MONOABS 1.3* 08/22/2013 1511   EOSABS 0.2 08/22/2013 1511   BASOSABS 0.0 08/22/2013 1511    CMP     Component Value Date/Time   NA 133* 08/22/2013 1511   K 4.3 08/22/2013 1511   CL 99 08/22/2013 1511   CO2 27 08/22/2013 1511   GLUCOSE 85 08/22/2013 1511   BUN 18 08/22/2013 1511   CREATININE 0.9 08/22/2013 1511   CALCIUM 9.3 08/22/2013 1511   PROT 6.5 08/22/2013 1511   ALBUMIN 3.6 08/22/2013 1511   AST 23 08/22/2013 1511   ALT 26 08/22/2013 1511   ALKPHOS 86 08/22/2013 1511   BILITOT 0.6 08/22/2013 1511   GFRNONAA 48* 01/06/2012 1145   GFRAA 55* 01/06/2012 1145   MRI ABDOMEN WITHOUT AND WITH CONTRAST -- 09/06/2013   TECHNIQUE: Multiplanar multisequence MR imaging of the abdomen was performed both before and after the administration of intravenous  contrast.   CONTRAST:  14mL MULTIHANCE GADOBENATE DIMEGLUMINE 529 MG/ML IV SOLN   COMPARISON:  Multiple exams, including 08/26/2013.   FINDINGS: MRI confirms multiple enhancing masses in the liver compatible hepatic metastatic disease diffusely. Representative lesions include 2.7 x 3.6 enhancing anteriorly segment 4A of the liver (image 20, series 4) and solid 3 point by 3.2 cm in segment 8 of the liver (image 14, series 4). Nearly 20 lesions are observed.   Suspected 1.2 x 1.0 cm left  adrenal, with associated signal dropout on of phase images within this small lesion.   An exophytic 2.0 x 1.6 lesion from the left kidney upper pole has high precontrast T1 signal characteristics and low T2 signal characteristics. No discernible enhancement subtraction images. Accordingly the appearance is compatible with Bosniak category 2 cyst. A tiny punctate Bosniak category 2 cyst of left kidney lower pole is shown, and several other cysts are present in the kidneys, without discernible renal mass.   L3 compression fracture appears to be old. No visualized osseous metastatic disease. Lower lumbar spondylosis and degenerative disc disease are present.   There is scattered colonic diverticulosis.   The pancreas is abnormal. A mass of the tip of the tail of the pancreas measures 1.7 x 2.5 cm and demonstrates initial arterial phase hypoenhancement but subsequent hyperenhancement compared to rest of the pancreas. Reference images 54 of series 1101 and image 54 of series 13. Above the pancreatic tail, a 1.0 x 1.4 cm structure is worrisome for satellite lesion or adjacent pathologic lymph. No upper abdominal vascular encasement by tumor. Conventional hepatic arterial anatomy. High position of the right renal artery origin incidentally noted.   IMPRESSION: 1. Pancreatic tail mass, likely the source of the multiple hepatic metastatic lesions. Tissue diagnosis recommended to discern histology. 2. Bosniak category 2 cyst of the left kidney upper pole (complex, but benign) requires no further workup. 3. Small left adrenal adenoma.  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  1.  Metastatic neuroendocrine tumor/nausea/indigestion/dyspepsia -- we spent a lot of time today discussing the pathology results and diagnosis of metastatic neuroendocrine tumor. We have discussed how I'm not 100% sure where this tumor originated, but it is very very likely of GI origin. We did discuss how it is possible this is arising in the tail of the pancreas but not definitely.  She is not currently having any symptoms of carcinoid syndrome, in fact she is constipated. She has an appointment coming up with Dr. Truett Perna on 09/26/2013 to discuss her new cancer diagnosis. For now I would like to try to improve her symptoms. In the past she has tried omeprazole and pantoprazole for indigestion without much benefit. I have given her samples of Dexilant 60 mg to use once daily. She can continue to use ranitidine in the evening and at bedtime, 300 mg, if necessary. I think it is possible that her tumor is secreting gastrin, which would cause hyper acid secretion. I will check a gastrin level today. --For her nausea I will add metoclopramide 5 mg 3 times daily and at bedtime. She can increase this to 10 mg if it is helping but not completely. We discussed the possibility of neurologic side effects and she will  let me know if she does not tolerate this medicine. She can continue to use Zofran as before as directed and as needed. --Repeat labs today to include CMP and gastrin level --I am canceling her upper endoscopy and colonoscopy which were scheduled for 09/22/2013, because we are ready have a diagnosis and these tests are unlikely to provide additional meaningful information at present.  She understands this reasoning. --She may benefit from the addition of octreotide, but I would like to see how she responds to the medications added today and Dr. Kalman Drape opinion in this regard  2.  Constipation -- she will use MiraLax 17 g daily and decrease to every other day if her stools are  too loose. She asked about a glycerin suppository and she can use this today if necessary. She already has this medicine at home.  3.  Anorexia -- felt to be secondary to cancer, ongoing nausea, and likely depressed mood over the recent diagnosis.  4.  Depressed mood -- situational and illness over the past month. We discussed possible SSRI, but we will not start this today. I have encouraged her to eat what she desires and what seems good to her. I've also encouraged her to return to water aerobics if she so desires. She seems encouraged by this. After a discussion of what is important to her, including her faith, we prayed together for her well-being/comfort.  I will see her back after she sees Dr. Truett Perna

## 2013-09-20 ENCOUNTER — Telehealth: Payer: Self-pay | Admitting: *Deleted

## 2013-09-20 NOTE — Telephone Encounter (Signed)
Great to hear She has her appointment with Dr. Truett Perna next week

## 2013-09-20 NOTE — Telephone Encounter (Signed)
Message copied by Florene Glen on Tue Sep 20, 2013  3:41 PM ------      Message from: Beverley Fiedler      Created: Mon Sep 19, 2013  8:11 AM       Labs were stable, serum gastrin was not elevated (meaning tumors likely not making acid-promoting hormones).        I spoke with her Friday night and she was still constipated, I gave her instructions on BID MiraLax until BMs      Though her nausea and indigestion were better at that point with reglan and Dexilant.      Please check on her today (is constipation better?)      Thanks             ------

## 2013-09-20 NOTE — Telephone Encounter (Signed)
Called pt who sounded so much better and states she feels so much better. Yesterday she reports was her 1st whole meal. She reports the reglan and Dexilant has really helped her and she is rarely using the Zofran. Instructed her to call for any questions or problems.

## 2013-09-21 NOTE — Telephone Encounter (Signed)
Noted  

## 2013-09-22 ENCOUNTER — Encounter: Payer: Medicare Other | Admitting: Internal Medicine

## 2013-09-23 ENCOUNTER — Telehealth: Payer: Self-pay | Admitting: Internal Medicine

## 2013-09-23 NOTE — Telephone Encounter (Signed)
Spoke to pt. Told her I will leave her samples at our front desk.

## 2013-09-26 ENCOUNTER — Encounter: Payer: Self-pay | Admitting: Oncology

## 2013-09-26 ENCOUNTER — Ambulatory Visit: Payer: Medicare Other

## 2013-09-26 ENCOUNTER — Telehealth: Payer: Self-pay | Admitting: Oncology

## 2013-09-26 ENCOUNTER — Ambulatory Visit (HOSPITAL_BASED_OUTPATIENT_CLINIC_OR_DEPARTMENT_OTHER): Payer: Medicare Other | Admitting: Oncology

## 2013-09-26 VITALS — BP 182/83 | HR 81 | Temp 97.4°F | Resp 18 | Ht 60.0 in | Wt 149.1 lb

## 2013-09-26 DIAGNOSIS — K869 Disease of pancreas, unspecified: Secondary | ICD-10-CM

## 2013-09-26 DIAGNOSIS — C787 Secondary malignant neoplasm of liver and intrahepatic bile duct: Secondary | ICD-10-CM

## 2013-09-26 DIAGNOSIS — R110 Nausea: Secondary | ICD-10-CM

## 2013-09-26 DIAGNOSIS — R630 Anorexia: Secondary | ICD-10-CM

## 2013-09-26 DIAGNOSIS — I10 Essential (primary) hypertension: Secondary | ICD-10-CM

## 2013-09-26 DIAGNOSIS — C7A098 Malignant carcinoid tumors of other sites: Secondary | ICD-10-CM

## 2013-09-26 NOTE — Progress Notes (Signed)
Met with Heather Parker and family. Explained role of nurse navigator. Educational information provided on Carcinoid  (NED) cancer. [facility] resources provided to patient, including SW service information.  Patient lives in independent living center at [place].  Her children are very supportive and deny any needs or barriers to care at present time.  Contact names and phone numbers were provided for entire [facility] team.  Teach back method was used.   Will continue to follow as needed.

## 2013-09-26 NOTE — Progress Notes (Signed)
Checked in new patient with no financial issues.  °

## 2013-09-26 NOTE — Progress Notes (Signed)
[facility] New Patient Consult   Referring MD:Jay Pyrtle   PAULETTA PICKNEY 79 y.o.  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     HPI: She reports developing anorexia and nausea beginning on 08/12/2013. She was placed on antacid therapy by her primary physician without benefit. She was referred to Dr. Rhea Belton and a CT of the abdomen and pelvis on 08/26/2013 revealed multiple hypovascular masses throughout the right and left liver consistent with metastases. A 2.1 cm subcapsular lesion in the upper pole of left kidney was felt to represent a hemorrhagic cyst versus a solid mass. No abdominal or pelvic lymphadenopathy. No abnormal soft tissue thickening or intraluminal mass involving the bowel. A CT the chest revealed no suspicious pulmonary nodules, adenopathy, or masses .  She was referred for an MRI of the abdomen 09/05/2013. This confirmed multiple enhancing masses in the liver compatible with metastatic disease the upper pole left renal lesion was compatible with a cyst. A mass was noted at the tail of the pancreas measuring 1.7 x 2.5 cm above the pancreatic he'll a 1 x 1.4 cm structure was concerning for a satellite lesion or adjacent lymph node.  She was referred for an ultrasound-guided biopsy of a right liver lesion on 09/09/2013. The pathology ([id number]) revealed a metastatic well-differentiated neuroendocrine tumor (carcinoid). The tumor cells were positive for synaptophysin, chromogranin, and CD 56. The cells were also positive for CDX-2 suggestive of a carcinoid tumor of gastrointestinal primary.  She saw Dr. Rhea Belton on 09/15/2013 4 discussion of the pathology findings. She reports "80% cold improvement in her symptoms since starting Dexilant,, metoclopramide, and ranitidine.  Past Medical History  Diagnosis Date  . Unspecified essential hypertension   . Regional enteritis of unspecified site    . Hemorrhoids   . GERD (gastroesophageal reflux disease)   . Ruptured lumbar disc   . Adenomatous rectal polyp  2013   . Arthritis   . Chronic kidney disease       . Sun-damaged skin 01/12/12    " Sun spots removed from face and left wrist, right hand frozen"  . Anal and rectal polyp 12/03/2011  . Pain in joint, lower leg 07/23/2011  .  G5, P3, 2 miscarriages  06/25/2011  .    Marland Kitchen Syncope and collapse 02/24/2011  .    .        . Closed fracture of vertebral column 11/26/2010  .    .    .    .    .    .    Marland Kitchen      Past Surgical History  Procedure Laterality Date  . Appendectomy    . Vaginal hysterectomy      still has ovaries  . Knee arthroscopy      Bi-lat  . Tonsillectomy and adenoidectomy    . Colonoscopy    .  surgical removal of a tubulovillous adenoma from the rectum    01/19/2012   . Other surgical history      lump removed right breast at age 25     Family History  Problem Relation Age of Onset  . Stomach cancer Sister   . Lung cancer Brother   . Heart disease Father   . Stroke Father   . Colon cancer Neg Hx    one sister, one brother, and one 1/2 brother. No other family history of cancer.  Current outpatient prescriptions:acetaminophen (TYLENOL) 500 MG  tablet, Take 1 tablet (500 mg total) by mouth 2 (two) times daily. Take one tablet twice daily to reduce back pain, Disp: , Rfl: ;  Biotin 5000 MCG CAPS, Take 1 capsule by mouth daily. , Disp: , Rfl: ;  BYSTOLIC 10 MG tablet, TAKE  ONE TABLET BY MOUTH DAILY TO TREAT HIGH BLOOD PRESSURE, Disp: 90 tablet, Rfl: 2 dexlansoprazole (DEXILANT) 60 MG capsule, Take 60 mg by mouth daily. (Samples from Dr. Rhea Belton), Disp: , Rfl: ;  metoCLOPramide (REGLAN) 5 MG tablet, Take 1 tablet (5 mg total) by mouth 4 (four) times daily., Disp: 30 tablet, Rfl: 0;  NIFEdipine (PROCARDIA-XL/ADALAT CC) 30 MG 24 hr tablet, Take 30 mg by mouth every morning. , Disp: , Rfl:  ondansetron (ZOFRAN ODT) 4 MG disintegrating tablet, Dissolve tab on  the tongue every 6 hours as needed for nausea., Disp: 40 tablet, Rfl: 2;  polyethylene glycol powder (GLYCOLAX/MIRALAX) powder, Take 255 g (1 Container total) by mouth daily., Disp: 255 g, Rfl: 3;  ranitidine (ZANTAC) 300 MG tablet, Take 300 mg by mouth 2 (two) times daily., Disp: , Rfl:  Vitamin D, Ergocalciferol, (DRISDOL) 50000 UNITS CAPS capsule, TAKE  ONE CAPSULE BY MOUTH EVERY 2 WEEKS, Disp: 6 capsule, Rfl: 1;  meclizine (ANTIVERT) 25 MG tablet, Take 25 mg by mouth. Take one up to 4 times daily as needed for dizziness, Disp: , Rfl:   Allergies:  Allergies  Allergen Reactions  . Codeine Nausea And Vomiting  . Darvocet [Propoxyphene-Acetaminophen] Other (See Comments)    Caused her to pass out  . Darvon Other (See Comments)    Pt unsure of reaction  . Protonix [Pantoprazole] Other (See Comments)    Dizziness and headaches    Social History: She relocated to [place] 3 years ago. She lives in [place]. Her daughter lives in [place]. She does not use tobacco or alcohol. No transfusion history. No risk factor for HIV or hepatitis. 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  Physical Exam:  Blood pressure 182/83, pulse 81, temperature 97.4 F (36.3 C), temperature source Oral, resp. rate 18, height 5' (1.524 m), weight 149 lb 1.6 oz (67.631 kg).  HEENT: Oral cavity without visible mass, I cannot visualize the pharynx. Neck without mass. Lungs: Clear bilaterally  Cardiac: Regular rate and rhythm Abdomen: No hepatomegaly, no apparent ascites, nontender, no mass  Vascular: No leg edema Lymph nodes: No cervical, supraclavicular, axillary, or inguinal nodes Neurologic: Alert and oriented, the motor exam appears  intact in the upper and lower extremities Skin: No rash, multiple moles over the trunk and extremity Musculoskeletal: No spine tenderness   LAB:  CBC  Lab Results  Component Value Date   WBC 11.0* 09/09/2013   HGB 13.8 09/09/2013   HCT 40.3 09/09/2013   MCV 89.6 09/09/2013   PLT 318 09/09/2013     CMP      Component Value Date/Time   NA 136 09/15/2013 1136   K 4.5 09/15/2013 1136   CL 102 09/15/2013 1136   CO2 27 09/15/2013 1136   GLUCOSE 108* 09/15/2013 1136   BUN 15 09/15/2013 1136   CREATININE 0.9 09/15/2013 1136   CALCIUM 9.4 09/15/2013 1136   PROT 6.5 09/15/2013 1136   ALBUMIN 3.6 09/15/2013 1136   AST  28 09/15/2013 1136   ALT 27 09/15/2013 1136   ALKPHOS 85 09/15/2013 1136   BILITOT 0.6 09/15/2013 1136   GFRNONAA 48* 01/06/2012 1145   GFRAA 55* 01/06/2012 1145   08/29/2013-AFP 92.3, CEA 2.1, CA 19-9 35.4    Radiology: As per history of present illness, I reviewed the 08/29/2013 abdomen CT in the 09/15/2013 abdomen MRI with Ms. Doughty and her family    Assessment/Plan:   1. 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 Tail of the pancreas mass on an MRI 09/15/2013  3. Nausea and anorexia-improved since starting dexilant, metoclopramide, and ranitidine  4. Hypertension  5. History of a tubulovillous adenoma of the rectum, status post transanal resection in April 2013   Disposition:   Ms. Dusek has been diagnosed with a metastatic neuroendocrine tumor. She most likely has a metastatic low grade pancreatic neuroendocrine tumor. She does not have symptoms of carcinoid syndrome. The gastrin level was not elevated and she does not have other symptoms to suggest a Diplomatic Services operational officer tumor.  I reviewed the CT images and discuss treatment options with Ms. Petrucelli and her family. No therapy will be curative. I explained the lack of effective systemic therapy for treating metastatic low grade neuroendocrine tumors. Her symptoms have  improved significantly with medical therapy prescribed by Dr. Rhea Belton.  I do not recommend somatostatin or systemic chemotherapy at present. We decided to obtain a 24-hour urine 5 HIAA and chromogranin A level within the next week. She will hold the dexilant for several days prior to obtaining these studies.  Ms. Teigen will return for an office visit on 10/20/2012. She will contact us in the interim for new symptoms. Her case will be presented at the GI tumor conference 09/28/2013 /  Approximately 50 minutes were spent with patient today. The chart of the time was used for counseling and coordination of care.    Shani Fitch 09/26/2013, 5:23 PM

## 2013-09-26 NOTE — Telephone Encounter (Signed)
appts made per 10/14/13 POF AVS and CAL given shh °

## 2013-09-27 ENCOUNTER — Telehealth: Payer: Self-pay | Admitting: Internal Medicine

## 2013-09-27 DIAGNOSIS — R110 Nausea: Secondary | ICD-10-CM

## 2013-09-27 MED ORDER — METOCLOPRAMIDE HCL 5 MG PO TABS: 5.0000 mg | ORAL_TABLET | Freq: Four times a day (QID) | ORAL | Status: DC

## 2013-09-27 NOTE — Telephone Encounter (Signed)
Reglan sent to pt's pharmacy

## 2013-09-28 ENCOUNTER — Telehealth: Payer: Self-pay | Admitting: *Deleted

## 2013-09-28 NOTE — Telephone Encounter (Signed)
Received phone call from patient.  We reviewed her schedule.  We reviewed the directions for collecting the 24 hour urine for 5-HIAA.  We reviewed the discussion that took place with MD this week.  She is aware of her upcoming appointments.  She expressed appreciation for my time and denied barriers to care at present.

## 2013-10-03 ENCOUNTER — Ambulatory Visit: Payer: Medicare Other

## 2013-10-03 DIAGNOSIS — C7A098 Malignant carcinoid tumors of other sites: Secondary | ICD-10-CM

## 2013-10-08 LAB — 5 HIAA, QUANTITATIVE, URINE, 24 HOUR
5-HIAA, 24 Hr Urine: 3.1 mg/24 h (ref <=6.0)
Volume, Urine-5HIAA: 1800 mL/24 h

## 2013-10-13 DIAGNOSIS — E871 Hypo-osmolality and hyponatremia: Secondary | ICD-10-CM

## 2013-10-13 DIAGNOSIS — M170 Bilateral primary osteoarthritis of knee: Secondary | ICD-10-CM

## 2013-10-13 DIAGNOSIS — C787 Secondary malignant neoplasm of liver and intrahepatic bile duct: Secondary | ICD-10-CM

## 2013-10-13 DIAGNOSIS — F32A Depression, unspecified: Secondary | ICD-10-CM

## 2013-10-13 DIAGNOSIS — R413 Other amnesia: Secondary | ICD-10-CM

## 2013-10-13 DIAGNOSIS — K5900 Constipation, unspecified: Secondary | ICD-10-CM

## 2013-10-13 HISTORY — DX: Secondary malignant neoplasm of liver and intrahepatic bile duct: C78.7

## 2013-10-13 HISTORY — DX: Other amnesia: R41.3

## 2013-10-13 HISTORY — DX: Bilateral primary osteoarthritis of knee: M17.0

## 2013-10-13 HISTORY — DX: Depression, unspecified: F32.A

## 2013-10-13 HISTORY — DX: Hypo-osmolality and hyponatremia: E87.1

## 2013-10-13 HISTORY — DX: Constipation, unspecified: K59.00

## 2013-10-14 ENCOUNTER — Telehealth: Payer: Self-pay | Admitting: Internal Medicine

## 2013-10-14 DIAGNOSIS — C787 Secondary malignant neoplasm of liver and intrahepatic bile duct: Secondary | ICD-10-CM

## 2013-10-14 DIAGNOSIS — L299 Pruritus, unspecified: Secondary | ICD-10-CM

## 2013-10-14 NOTE — Telephone Encounter (Signed)
Informed pt the itching may be coming from her liver. She will come in Tuesday for labs. Pt stated understanding.

## 2013-10-14 NOTE — Telephone Encounter (Signed)
Pt reports she has developed itching between her fingers, on her hands and on her ankles, not her feet. She bought OTC cortisone cream which relieves the itching for a while. She states we put her on Dexilant and Reglan before the itching started. Itching is listed as a SE of Reglan; please advise. Thanks.

## 2013-10-14 NOTE — Telephone Encounter (Signed)
Itching can be associated with cholestasis and can be seen in patients with liver tumors Reglan is felt to be less likely causing the itching Would recommend a hepatic function panel and basic metabolic panel on Monday She also has followup with Dr. Benay Spice next week

## 2013-10-17 NOTE — Telephone Encounter (Signed)
Pt will have labs tomorrow.

## 2013-10-18 ENCOUNTER — Other Ambulatory Visit (INDEPENDENT_AMBULATORY_CARE_PROVIDER_SITE_OTHER): Payer: Medicare Other

## 2013-10-18 DIAGNOSIS — C787 Secondary malignant neoplasm of liver and intrahepatic bile duct: Secondary | ICD-10-CM

## 2013-10-18 DIAGNOSIS — L299 Pruritus, unspecified: Secondary | ICD-10-CM

## 2013-10-18 LAB — HEPATIC FUNCTION PANEL
ALBUMIN: 3.7 g/dL (ref 3.5–5.2)
ALT: 29 U/L (ref 0–35)
AST: 29 U/L (ref 0–37)
Alkaline Phosphatase: 94 U/L (ref 39–117)
Bilirubin, Direct: 0.1 mg/dL (ref 0.0–0.3)
TOTAL PROTEIN: 6.4 g/dL (ref 6.0–8.3)
Total Bilirubin: 0.6 mg/dL (ref 0.3–1.2)

## 2013-10-20 ENCOUNTER — Ambulatory Visit (HOSPITAL_BASED_OUTPATIENT_CLINIC_OR_DEPARTMENT_OTHER): Payer: Medicare Other | Admitting: Nurse Practitioner

## 2013-10-20 ENCOUNTER — Encounter (INDEPENDENT_AMBULATORY_CARE_PROVIDER_SITE_OTHER): Payer: Self-pay

## 2013-10-20 ENCOUNTER — Other Ambulatory Visit (HOSPITAL_BASED_OUTPATIENT_CLINIC_OR_DEPARTMENT_OTHER): Payer: Medicare Other

## 2013-10-20 VITALS — BP 139/86 | HR 85 | Temp 96.9°F | Resp 19 | Ht 60.0 in | Wt 146.9 lb

## 2013-10-20 DIAGNOSIS — C787 Secondary malignant neoplasm of liver and intrahepatic bile duct: Secondary | ICD-10-CM

## 2013-10-20 DIAGNOSIS — K869 Disease of pancreas, unspecified: Secondary | ICD-10-CM

## 2013-10-20 DIAGNOSIS — C7A00 Malignant carcinoid tumor of unspecified site: Secondary | ICD-10-CM

## 2013-10-20 DIAGNOSIS — R5383 Other fatigue: Secondary | ICD-10-CM

## 2013-10-20 DIAGNOSIS — D3A8 Other benign neuroendocrine tumors: Secondary | ICD-10-CM

## 2013-10-20 DIAGNOSIS — R630 Anorexia: Secondary | ICD-10-CM

## 2013-10-20 DIAGNOSIS — R5381 Other malaise: Secondary | ICD-10-CM

## 2013-10-20 DIAGNOSIS — I10 Essential (primary) hypertension: Secondary | ICD-10-CM

## 2013-10-20 DIAGNOSIS — C7A098 Malignant carcinoid tumors of other sites: Secondary | ICD-10-CM

## 2013-10-20 NOTE — Progress Notes (Addendum)
OFFICE PROGRESS NOTE  Interval history:  Heather Parker returns for scheduled followup of recently diagnosed metastatic neuroendocrine tumor. Appetite continues to be diminished. She thinks she has lost weight since her last visit. She has mild intermittent nausea. No vomiting. She takes Zofran as needed. She does not require Zofran on a daily basis. She has had some loose stools today. She denies frequent diarrhea. No abdominal pain. She denies flushing and fever. Main complaint is a lack of energy.   Objective: Filed Vitals:   10/20/13 1337  BP: 139/86  Pulse: 85  Temp: 96.9 F (36.1 C)  Resp: 19   No thrush. No palpable cervical or supraclavicular lymph nodes. Lungs are clear. Regular cardiac rhythm. Abdomen soft and nontender. No hepatomegaly. No mass. No leg edema.   Lab Results: Lab Results  Component Value Date   WBC 11.0* 09/09/2013   HGB 13.8 09/09/2013   HCT 40.3 09/09/2013   MCV 89.6 09/09/2013   PLT 318 09/09/2013   NEUTROABS 6.8 08/22/2013    Chemistry:    Chemistry      Component Value Date/Time   NA 136 09/15/2013 1136   K 4.5 09/15/2013 1136   CL 102 09/15/2013 1136   CO2 27 09/15/2013 1136   BUN 15 09/15/2013 1136   CREATININE 0.9 09/15/2013 1136      Component Value Date/Time   CALCIUM 9.4 09/15/2013 1136   ALKPHOS 94 10/18/2013 0929   AST 29 10/18/2013 0929   ALT 29 10/18/2013 0929   BILITOT 0.6 10/18/2013 0929       Studies/Results: No results found.  Medications: I have reviewed the patient's current medications.  Assessment/Plan: 1. Metastatic well differentiated neuroendocrine tumor involving a liver biopsy 09/09/2013. 2. Tail of the pancreas mass on MRI 09/15/2013. 3. Nausea and anorexia. Improved with a Dexilant, metoclopramide and ranitidine. 4. Hypertension. 5. 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 6. Fatigue.   Dispositon-Dr. Benay Spice reviewed with Heather Parker and her son that she most  likely has a metastatic low-grade pancreatic neuroendocrine tumor. She does not have symptoms to suggest carcinoid syndrome. The fatigue and anorexia she is experiencing are likely related to cancer.  Dr. Benay Spice again reviewed the lack of effective systemic therapy for treatment of this disease. Heather Parker stated that she was not interested in systemic therapy or therapy directed at the liver.  It was mutually decided that we would follow on an observation approach with symptom management. We discussed a followup visit in approximately 8 weeks. Heather Parker and her son prefer to not schedule followup at this time and to contact our office should the need arise. We will request the nurse practitioner at [facility] to please contact our office if we can assist in any way (our contact information was provided to the [facility] nursing office with a request to call us to discuss her care).  Patient seen with Dr. Benay Spice.  25 minutes were spent face-to-face at today's visit with the majority of that time involved in counseling/coordination of care.   Ned Card ANP/GNP-BC  This was a shared visit with Ned Card. She complains of malaise. This is likely related to the metastatic neuroendocrine tumor involving the liver. Effect of systemic treatment options are limited. She does not wish to consider hepatic directed therapy. Heather Parker and her son prefer that she be followed by the medical staff at [facility]. We are available to see her as needed.  Julieanne Parker, M.D.

## 2013-10-25 ENCOUNTER — Telehealth: Payer: Self-pay | Admitting: *Deleted

## 2013-10-25 LAB — CHROMOGRANIN A: CHROMOGRANIN A: 186 ng/mL — AB (ref 1.9–15.0)

## 2013-10-25 NOTE — Telephone Encounter (Signed)
Called to inquire if her cancer is rapid or slow growing? Also asking if exercise would help her weakness and malaise? Per Dr. Benay Spice, made her aware that her cancer is slow growing and he expects slow progression. A gentle, supervised exercise program with some light weight lifting may improve her strength and energy. Suggested she talk with the physician at [place] about how to get someone to develop this for her.

## 2013-10-26 ENCOUNTER — Encounter: Payer: Self-pay | Admitting: Geriatric Medicine

## 2013-10-26 ENCOUNTER — Non-Acute Institutional Stay: Payer: Medicare Other | Admitting: Geriatric Medicine

## 2013-10-26 VITALS — BP 148/76 | HR 72 | Wt 143.0 lb

## 2013-10-26 DIAGNOSIS — I10 Essential (primary) hypertension: Secondary | ICD-10-CM

## 2013-10-26 DIAGNOSIS — IMO0002 Reserved for concepts with insufficient information to code with codable children: Secondary | ICD-10-CM

## 2013-10-26 DIAGNOSIS — C7A098 Malignant carcinoid tumors of other sites: Secondary | ICD-10-CM

## 2013-10-26 DIAGNOSIS — R21 Rash and other nonspecific skin eruption: Secondary | ICD-10-CM

## 2013-10-26 DIAGNOSIS — D3A8 Other benign neuroendocrine tumors: Secondary | ICD-10-CM

## 2013-10-26 HISTORY — DX: Other benign neuroendocrine tumors: D3A.8

## 2013-10-26 NOTE — Progress Notes (Signed)
Patient ID: Heather Parker, female   DOB: [birth year], 80 y.o.   MRN: [id number]  [facility] [phone])  Code Status: Full Code      Contact Information   Name Relation Home Work [place] Daughter [phone]        Chief Complaint  Patient presents with  . Medical Managment of Chronic Issues    weakness and malaise. Wants to start exercise program. Oncology doctor sugguest primary doctor to help her with this.    HPI: This is a 80 y.o. female resident of [facility], Independent Living section evaluated today for management of ongoing medical issues.    Last visit:  Abnormal CT of liver Multiple liver masses consistent with metastasis. Further investigations have been scheduled by Dr. Hilarie Fredrickson, patient will followup with him in early December.  Patient is interested diagnosing this problem, not sure she will pursue treatment. Will be available to patient as needed  Abnormal blood sugar A1c satisfactory, no diabetes  HTN (hypertension) Blood pressure remains well-controlled on current medications   Since last visit, patient had a liver biopsy, pathology shows metastatic neuroendocrine tumor. Dr. Gearldine Shown opinion is most likely a metastatic low grade pancreatic neuroendocrine tumor. No specific treatment has been recommended. Dr. Benay Spice anticipates this will be a slow-growing tumor and progression of disease symptoms may also be slowly progressive.  Today's c/o are fatigue, sore bottom, back pain.  Appetite fair, 7lb weight loss over last 2 months. Patient tells me she would like to go to the fitness center and attend programs however she's to weak and tired to walk to these things. Asks about services to help her.  Allergies  Allergen Reactions  . Codeine Nausea And Vomiting  . Darvocet [Propoxyphene N-Acetaminophen] Other (See Comments)    Caused her to pass out  . Darvon Other (See Comments)    Pt unsure of  reaction  . Protonix [Pantoprazole] Other (See Comments)    Dizziness and headaches     Medication List       This list is accurate as of: 10/26/13  8:51 AM.  Always use your most recent med list.               acetaminophen 500 MG tablet  Commonly known as:  TYLENOL  Take 1 tablet (500 mg total) by mouth 2 (two) times daily. Take one tablet twice daily to reduce back pain     Biotin 5000 MCG Caps  Take 1 capsule by mouth daily.     BYSTOLIC 10 MG tablet  Generic drug:  nebivolol  TAKE  ONE TABLET BY MOUTH DAILY TO TREAT HIGH BLOOD PRESSURE     dexlansoprazole 60 MG capsule  Commonly known as:  DEXILANT  Take 60 mg by mouth daily. (Samples from Dr. Hilarie Fredrickson)     meclizine 25 MG tablet  Commonly known as:  ANTIVERT  Take 25 mg by mouth. Take one up to 4 times daily as needed for dizziness     metoCLOPramide 5 MG tablet  Commonly known as:  REGLAN  Take 1 tablet (5 mg total) by mouth 4 (four) times daily.     NIFEdipine 30 MG 24 hr tablet  Commonly known as:  PROCARDIA-XL/ADALAT CC  Take 30 mg by mouth every morning.     ondansetron 4 MG disintegrating tablet  Commonly known as:  ZOFRAN ODT  Dissolve tab on the tongue every 6 hours as needed for nausea.  polyethylene glycol powder powder  Commonly known as:  GLYCOLAX/MIRALAX  Take 255 g (1 Container total) by mouth daily.     ranitidine 300 MG tablet  Commonly known as:  ZANTAC  Take 300 mg by mouth 2 (two) times daily.     Vitamin D (Ergocalciferol) 50000 UNITS Caps capsule  Commonly known as:  DRISDOL  TAKE  ONE CAPSULE BY MOUTH EVERY 2 WEEKS        Data Reviewed       EGB:TDVVOHY, external  10/26/2012 CBC: Wbc 9.5, Rbc 4.56, Hgb 13.6, Hct 40.3, Platelet 317 CMP: Sodium 139, Potassium 4.9, glucose 115, BUN 21, Creatinine 1.10 Lipid: cholesterol 188, triglyceride 90, HDL 50, LDL 120  05/26/2013 glucose 115, BUN 19, creatinine 1.06, sodium 139, potassium 4.8. Protein/LFTs WNL.  TSH 2.201 06/02/2013  A1c 6.0   Lab Results- [facility]  Component Value Date   WBC 11.0* 09/09/2013   HGB 13.8 09/09/2013   HCT 40.3 09/09/2013   PLT 318 09/09/2013  [place] Component     Latest Ref Rng 09/15/2013 10/18/2013  Sodium     135 - 145 mEq/L 136   Potassium     3.5 - 5.1 mEq/L 4.5   Chloride     96 - 112 mEq/L 102   CO2     19 - 32 mEq/L 27   Glucose     70 - 99 mg/dL 108 (H)   BUN     6 - 23 mg/dL 15   Creatinine     0.4 - 1.2 mg/dL 0.9   Total Bilirubin     0.3 - 1.2 mg/dL 0.6 0.6  Alkaline Phosphatase     39 - 117 U/L 85 94  AST     0 - 37 U/L 28 29  ALT     0 - 35 U/L 27 29  Total Protein     6.0 - 8.3 g/dL 6.5 6.4  Albumin     3.5 - 5.2 g/dL 3.6 3.7  Calcium     8.4 - 10.5 mg/dL 9.4   GFR     >60.00 mL/min 65.92      REVIEW OF SYSTEMS DATA OBTAINED: from patient GENERAL: Feels OK, tired. No fevers, fair appetite, mild weight loss SKIN: Skin is itchy, dry. No rash or open wounds.  EYES: No eye pain, dryness or itching  No change in vision EARS: No earache, tinnitus, change in hearing NOSE: No congestion, drainage or bleeding MOUTH/THROAT: No mouth or tooth pain  No difficulty chewing or swallowing RESPIRATORY: No cough, wheezing, SOB CARDIAC: No chest pain, palpitations  No edema. GI: No c/o upper abdominal discomfort, fullness or nausea . No V/D or constipation  Denies Occ. heartburn or reflux   Fecal seepage /anal skin irritation GU: No dysuria, frequency or urgency  No change in urine volume or character No nocturia or change in stream   MUSCULOSKELETAL:   Back pain, unchanged.  Rt. Knee pain No muscle ache, pain, weakness  Gait is steady  No recent falls.  NEUROLOGIC: No dizziness, fainting, headache  No change in mental status.  PSYCHIATRIC: Mild feelings of anxiety, depression  Sleeps OK.     PHYSICAL EXAM  Filed Vitals:   10/26/13 0846  BP: 148/76  Pulse: 72  Weight: 143 lb (64.864 kg)   Body mass index is 27.93 kg/(m^2).  GENERAL APPEARANCE: No acute  distress, appropriately groomed, overweight body habitus,. Alert, pleasant, less conversant than usual. SKIN:  Buttock skin with area of  irritation, no open areas, no blisters or drainage HEAD: Normocephalic, atraumatic EYES: Conjunctiva/lids clear.  EARS:  Hearing grossly normal. NOSE: No deformity or discharge. MOUTH/THROAT: Lips w/o lesions. Oral mucosa, tongue moist, w/o lesion. Oropharynx w/o redness or lesions.  NECK: Supple, full ROM. No thyroid tenderness, enlargement or nodule LYMPHATICS: No head, neck or supraclavicular adenopathy RESPIRATORY: Breathing is even, unlabored. Lung sounds are clear and full.  CARDIOVASCULAR: Heart RRR. No murmur or extra heart sounds  EDEMA: No peripheral  edema. MUSCULOSKELETAL: Moves all extremities with full ROM, strength and tone. Back is without kyphosis, scoliosis or spinal process tenderness. Gait is steady NEUROLOGIC: Oriented to time, place, person. Speech clear, no tremor.  PSYCHIATRIC: Mildly depressed mood and affect.  ASSESSMENT/PLAN  HTN (hypertension) Blood pressure remains adequately controlled, continue current medications, recent lab satisfactory  Neuroendocrine tumor of pancreas Metastatic well differentiated neuroendocrine tumor involving a liver biopsy 09/09/2013. Tail of the pancreas mass on MRI 09/15/2013. She symptoms at this time is fatigue, does continue to have a fair to poor appetite. Mild weight loss. No abdominal discomfort or nausea today. Continue current medications for GI symptoms. Follow up Dr. Elmo Putt as needed. Encouraged patient to obtain services through [facility] to help with transporting to fitness center and general activities. As fatigue worsens may consider use of power wheelchair for mobility.   Rash and nonspecific skin eruption Buttock skin irritation has not responded to Calmoseptine barrier agent. Will try clobetasol for 2 weeks.  Degenerative disc disease Chronic back pain due to  lumbar disc disease. This along with fatigue contributes to patient's limited mobility. She continues to walk short distances safely with a walker  Time: 64minutes, >50% spent counseling/or care coordination  Follow up: 2 months  Skip Litke T.Mariah Harn, NP-C 10/26/2013

## 2013-10-26 NOTE — Assessment & Plan Note (Signed)
Buttock skin irritation has not responded to Calmoseptine barrier agent. Will try clobetasol for 2 weeks.

## 2013-10-26 NOTE — Assessment & Plan Note (Signed)
Blood pressure remains adequately controlled, continue current medications, recent lab satisfactory

## 2013-10-26 NOTE — Assessment & Plan Note (Signed)
Metastatic well differentiated neuroendocrine tumor involving a liver biopsy 09/09/2013. Tail of the pancreas mass on MRI 09/15/2013. She symptoms at this time is fatigue, does continue to have a fair to poor appetite. Mild weight loss. No abdominal discomfort or nausea today. Continue current medications for GI symptoms. Follow up Dr. Elmo Putt as needed. Encouraged patient to obtain services through [facility] to help with transporting to fitness center and general activities. As fatigue worsens may consider use of power wheelchair for mobility.

## 2013-10-26 NOTE — Assessment & Plan Note (Signed)
Chronic back pain due to lumbar disc disease. This along with fatigue contributes to patient's limited mobility. She continues to walk short distances safely with a walker

## 2013-11-17 ENCOUNTER — Telehealth: Payer: Self-pay | Admitting: Internal Medicine

## 2013-11-17 MED ORDER — PROMETHAZINE HCL 12.5 MG PO TABS: ORAL_TABLET | ORAL | Status: DC

## 2013-11-17 NOTE — Telephone Encounter (Signed)
Pt reports her nausea is getting worse. She had nausea all night and has it this morning; took a zofran odt and is a little better although she has no appetite. States her bowels are functioning pretty good with Miralax. She has an appt on 11/23/13 and I offered her an appt with an APP, but she would rather see Dr Hilarie Fredrickson.. Dr Hilarie Fredrickson, can we increase Zofran ODT to 8mg  or shorten the 6 hr interval or can we give her phenergan to help her sleep at night and have the same zofran script? Thanks.

## 2013-11-17 NOTE — Telephone Encounter (Signed)
Informed pt Dr Hilarie Fredrickson wants to add a drug for sleep, but we must use with caution in the elderly. We hope it will help her nausea and it can help her sleep. The drug may make her unsteady and irt can cause hallucinations, but it is a low dose. Pt stated understanding,

## 2013-11-17 NOTE — Telephone Encounter (Signed)
Can decrease zofran interval to 4 mg every 6 hours and given PRN promethazine at bedtime for nausea (12.5 mg of promethazine given her age)

## 2013-11-17 NOTE — Telephone Encounter (Signed)
lmom for pt to call back

## 2013-11-22 ENCOUNTER — Encounter: Payer: Self-pay | Admitting: Internal Medicine

## 2013-11-23 ENCOUNTER — Other Ambulatory Visit: Payer: Self-pay | Admitting: Gastroenterology

## 2013-11-23 ENCOUNTER — Encounter: Payer: Self-pay | Admitting: Internal Medicine

## 2013-11-23 ENCOUNTER — Ambulatory Visit (INDEPENDENT_AMBULATORY_CARE_PROVIDER_SITE_OTHER): Payer: Medicare Other | Admitting: Internal Medicine

## 2013-11-23 ENCOUNTER — Telehealth: Payer: Self-pay | Admitting: Gastroenterology

## 2013-11-23 VITALS — BP 120/78 | HR 72 | Ht 60.0 in | Wt 145.4 lb

## 2013-11-23 DIAGNOSIS — C229 Malignant neoplasm of liver, not specified as primary or secondary: Secondary | ICD-10-CM

## 2013-11-23 DIAGNOSIS — R932 Abnormal findings on diagnostic imaging of liver and biliary tract: Secondary | ICD-10-CM

## 2013-11-23 DIAGNOSIS — R110 Nausea: Secondary | ICD-10-CM

## 2013-11-23 DIAGNOSIS — C7A098 Malignant carcinoid tumors of other sites: Secondary | ICD-10-CM

## 2013-11-23 DIAGNOSIS — D3A8 Other benign neuroendocrine tumors: Secondary | ICD-10-CM

## 2013-11-23 DIAGNOSIS — R634 Abnormal weight loss: Secondary | ICD-10-CM

## 2013-11-23 MED ORDER — METOCLOPRAMIDE HCL 5 MG PO TABS: 5.0000 mg | ORAL_TABLET | Freq: Four times a day (QID) | ORAL | Status: DC

## 2013-11-23 MED ORDER — MIRTAZAPINE 7.5 MG PO TABS: ORAL_TABLET | ORAL | Status: DC

## 2013-11-23 MED ORDER — ONDANSETRON 4 MG PO TBDP: ORAL_TABLET | ORAL | Status: DC

## 2013-11-23 MED ORDER — DEXLANSOPRAZOLE 60 MG PO CPDR: 60.0000 mg | DELAYED_RELEASE_CAPSULE | Freq: Every day | ORAL | Status: DC

## 2013-11-23 MED ORDER — SENNA 8.6 MG PO TABS: 1.0000 | ORAL_TABLET | Freq: Every day | ORAL | Status: DC

## 2013-11-23 NOTE — Telephone Encounter (Signed)
Spoke to pt. Told her of the medication change per dr. Hilarie Fredrickson. Pt verbalized understanding.

## 2013-11-23 NOTE — Patient Instructions (Signed)
We have sent the following medications to your pharmacy for you to pick up at your convenience: Discontinue taking Phenergan.   Continue taking Dexilant, Zofran and Reglan. Mirtzapine was sent to you pharamcy. You can take 2 tablets at bedtime daily.  Follow up with Dr. Hilarie Fredrickson in 1 month

## 2013-11-23 NOTE — Progress Notes (Signed)
Subjective:    Patient ID: Heather Parker, female    DOB: Jan 25, 1922, 80 y.o.   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 She is here today with her daughter and grandson. She is doing better than in early December when I saw her last but she continues to have trouble with nausea and fatigue. She is somewhat down but she cannot do the activities that she is used to doing. She reports she was previously very active and involved at her living facility with aerobics and exercises. Now she reports is difficult for her simply to walk to the dining room. She is not having chest pain or dyspnea but just feels tired.  She reports her reflux and epigastric burning is well-controlled on Dexilant 60 mg daily. She is having issues with constipation, which is new for her. She was using MiraLax 17 g daily which she says didn't work for 4 days and then she had several days of diarrhea which was too much for her. She is requesting another option. She is using metoclopramide 5 mg 4 times daily and Zofran 4 mg 4 times daily, which is helping some with the nausea though not completely. She is starting to feel that her diet choices may contribute to worsen on it at times. She did try Sinemet at bedtime and became confused, woke up disoriented and couldn't even find her bathroom. Her sleep is mostly been good. Appetite is somewhat poor.  Review of Systems As per history of present illness, otherwise negative  Current Medications, Allergies, Past Medical History, Past Surgical History, Family History and Social History were reviewed in [employer] record.     Objective:   Physical Exam BP 120/78  Pulse 72  Ht 5' (1.524 m)  Wt 145 lb 6.4 oz (65.953 kg)  BMI 28.40  kg/m2 Constitutional: A pleasant and still smiling elderly female in no acute distress. Color is good and she looks well today HEENT: Normocephalic and atraumatic.No scleral icterus. Abdominal: Soft, nontender, nondistended. Extremities: no clubbing, cyanosis, or edema Neurological: Alert and oriented to person place and time. Psychiatric: Normal mood and affect. Behavior is normal.      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   1.  Metastatic neuroendocrine pancreatic cancer/nausea/constipation/malaise and fatigue -- she did meet with Dr. Benay Spice, oncology, and the decision was made not to pursue specific treatment for her metastatic NET.  Attention was directed towards alleviating symptoms.  I do feel that she is benefiting from Ages and we will continue 60 mg daily. We will also continue with metoclopramide 5 mg before meals and at bedtime. She can use Zofran 4 mg every 6 hours and take 2 additional tablets throughout a 24-hour period as needed for nausea. She is aware the maximum dose of ondansetron is 24 mg daily. I will discontinue promethazine as she had an unfavorable reaction. She did not like her response to MiraLax and so I have recommended Senokot-S one tablet each bedtime for constipation. We talked about mood, which overall I think is fairly good but also appetite stimulation. We discussed possibly the addition  of Megace versus mirtazapine. After discussion I will start her on mirtazapine 15 mg at bedtime. Hopefully this will help her mood but also her appetite. She asked to see me back every month which I am more than happy to do. I will see her back in 4 weeks, sooner if necessary. Both she and her daughter are happy with this plan

## 2013-11-30 ENCOUNTER — Telehealth: Payer: Self-pay | Admitting: Internal Medicine

## 2013-11-30 NOTE — Telephone Encounter (Signed)
Patient reports that she is having difficulty urinating, denies pain or burning.  She reports that she also has swelling in her feet bilaterally.  She usually has swelling in one foot, but now has bilateral edema.  She also has severe constipation.  She reports that the staff nurse at [facility] is going to come and give her an enema.  Miralax and senokot is not helping at all.  I have asked that if she can see the NP today at [facility] for edema and difficulty urinating, and I will call her back after Dr. Hilarie Fredrickson can review

## 2013-12-01 ENCOUNTER — Telehealth: Payer: Self-pay | Admitting: *Deleted

## 2013-12-01 MED ORDER — LUBIPROSTONE 8 MCG PO CAPS: 8.0000 ug | ORAL_CAPSULE | Freq: Two times a day (BID) | ORAL | Status: DC

## 2013-12-01 NOTE — Telephone Encounter (Signed)
See phone note 12/01/13 for additional documentation

## 2013-12-01 NOTE — Telephone Encounter (Signed)
Agree completely that she should be evaluated by medical staff at [facility] for her urinary complaint to exclude UTI Given her ongoing issues with constipation, trial of lubiprostone 8 mg twice daily Can d/c senna and Miralax once a lubiprostone is initiated

## 2013-12-01 NOTE — Telephone Encounter (Signed)
Patient advised rx sent to pharmacy She is advised again to contact the nurse at [facility] to evaluate her UTI I have left a message for the health coordinator at [facility] about patient needing to be evaluated for a UTI, she will pass on to [facility] patient's nurse.

## 2013-12-01 NOTE — Telephone Encounter (Signed)
Received a message from [place] that patient is waiting on a call back about her constipation.

## 2013-12-02 ENCOUNTER — Telehealth: Payer: Self-pay

## 2013-12-02 ENCOUNTER — Other Ambulatory Visit: Payer: Self-pay

## 2013-12-02 MED ORDER — SACCHAROMYCES BOULARDII 250 MG PO CAPS: 250.0000 mg | ORAL_CAPSULE | Freq: Two times a day (BID) | ORAL | Status: DC

## 2013-12-02 MED ORDER — SULFAMETHOXAZOLE-TMP DS 800-160 MG PO TABS: ORAL_TABLET | ORAL | Status: DC

## 2013-12-02 NOTE — Telephone Encounter (Signed)
Received urine report from 12/02/13 Nitrite positive, T[facility]ia wrote Rx for Sulfa DS one daily for 7 days, Florastor twice daily for 7 days. Faxed RX to [facility], called patient. The culture is pending, when it comes in if it shows that the Sulfa is the correct Rx we will call her.

## 2013-12-05 ENCOUNTER — Telehealth: Payer: Self-pay

## 2013-12-05 NOTE — Telephone Encounter (Signed)
Called patient with urine culture, Sulfa DS is correct medication, be should to take all of them (#7). Patient understood.

## 2013-12-07 ENCOUNTER — Non-Acute Institutional Stay: Payer: Medicare Other | Admitting: Geriatric Medicine

## 2013-12-07 ENCOUNTER — Encounter: Payer: Self-pay | Admitting: Geriatric Medicine

## 2013-12-07 ENCOUNTER — Telehealth: Payer: Self-pay

## 2013-12-07 ENCOUNTER — Telehealth: Payer: Self-pay | Admitting: Internal Medicine

## 2013-12-07 ENCOUNTER — Other Ambulatory Visit: Payer: Self-pay

## 2013-12-07 VITALS — BP 140/62 | HR 74 | Temp 96.9°F | Wt 143.0 lb

## 2013-12-07 DIAGNOSIS — I10 Essential (primary) hypertension: Secondary | ICD-10-CM

## 2013-12-07 DIAGNOSIS — D3A8 Other benign neuroendocrine tumors: Secondary | ICD-10-CM

## 2013-12-07 DIAGNOSIS — C7A098 Malignant carcinoid tumors of other sites: Secondary | ICD-10-CM

## 2013-12-07 DIAGNOSIS — K5900 Constipation, unspecified: Secondary | ICD-10-CM | POA: Insufficient documentation

## 2013-12-07 DIAGNOSIS — N390 Urinary tract infection, site not specified: Secondary | ICD-10-CM

## 2013-12-07 MED ORDER — PROMETHAZINE HCL 12.5 MG PO TABS: ORAL_TABLET | ORAL | Status: DC

## 2013-12-07 NOTE — Assessment & Plan Note (Addendum)
Metastatic well differentiated neuroendocrine tumor involving a liver biopsy 09/09/2013. Tail of the pancreas mass on MRI 09/15/2013. Evaluated by Dr. Benay Spice at oncology. No curative therapy available, recommends focus on symptom management. Chief symptoms have been nausea and fatigue. Dr. Hilarie Fredrickson of gastroenterology has been working with patient to manage nausea. Initially Dexilant was helping her nausea, lately however, patient feels she's not getting any relief despite Dexilant, metoclopramide, Zofran, Zantac. Promethazine was prescribed but it isn't clear whether she actually took this medicine. If she has not, recommend starting low-dose at bedtime.  The patient is nauseous "all the time", has abdominal pain. Is not eating well, has experienced weight loss, is not sleeping well. Discussed hospice care with the patient today. She is having difficulty with symptom management and also displays anxiety and depression regarding her diagnosis and future. Patient's daughter is her main support, she does visit daily. Is currently out of town. She likely will benefit from the support, symptom management and education available with hospice team services. Although this tumor is classified as low-grade and patient has been told it is slow growing, she is having significant symptoms and if continues on this trajectory I would not be surprised if she survives less than 6 months. Patient agrees to referral to Hospice and Palliative care services of  [place].

## 2013-12-07 NOTE — Progress Notes (Signed)
[facility] (12)  Code Status: Full Code  Contact Information   Name Relation Home Work [place] Daughter [phone]         Chief Complaint  Patient presents with  . Nausea    all the time, Zofran helps for short time. Wants shots! No appetitie, can't sleep. Patient crying "can''t live like this"! This is slow growing cancer, she has to have some help"! Had a good BM this morning she said.    HPI: This is a 80 y.o. female resident of [place],  Independent Living  section.  Evaluation is requested today due to nausea/vomiting.  Patient tells me she is sick and nauseous all the time, stomach hurts, "I just don't know what to do about this, this is no way to live ". Significant time spent today reviewing patient's medications, she's not sure she ever took any promethazine. Is taking Zofran every 6 hours,Dexilant daily, metoclopramide 4 times a day. She is taking Zantac intermittently at night. Takes mirtazapine at night as well. Is not eating much, sleep is very poor. Tells me that Amitiza caused very bad nausea after one pill; she has not taken anymore of it. MiraLax is working very well to manage constipation. Patient was diagnosed with a UTI 11/2013, Bactrim was prescribed. Patient tells me she started this medicine Monday 2/23. Does not feel any better or worse since starting this medication.    Allergies  Allergen Reactions  . Ciprofloxacin Nausea Only    headaches  . Codeine Nausea And Vomiting  . Darvocet [Propoxyphene N-Acetaminophen] Other (See Comments)    Caused her to pass out  . Darvon Other (See Comments)    Pt unsure of reaction  . Protonix [Pantoprazole] Other (See Comments)    Dizziness and headaches       Medication List       This list is accurate as of: 12/07/13  1:43 PM.  Always use your most recent med list.               acetaminophen 500 MG tablet  Commonly known as:  TYLENOL    Take 1 tablet (500 mg total) by mouth 2 (two) times daily. Take one tablet twice daily to reduce back pain     BYSTOLIC 10 MG tablet  Generic drug:  nebivolol  TAKE  ONE TABLET BY MOUTH DAILY TO TREAT HIGH BLOOD PRESSURE     dexlansoprazole 60 MG capsule  Commonly known as:  DEXILANT  Take 1 capsule (60 mg total) by mouth daily. (Samples from Dr. Hilarie Fredrickson)     meclizine 25 MG tablet  Commonly known as:  ANTIVERT  Take 25 mg by mouth. Take one up to 4 times daily as needed for dizziness     metoCLOPramide 5 MG tablet  Commonly known as:  REGLAN  Take 1 tablet (5 mg total) by mouth 4 (four) times daily.     mirtazapine 7.5 MG tablet  Commonly known as:  REMERON  Take 2 tablets 15 mg.  by mouth at bedtime     NIFEdipine 30 MG 24 hr tablet  Commonly known as:  PROCARDIA-XL/ADALAT CC  Take 30 mg by mouth every morning.     ondansetron 4 MG disintegrating tablet  Commonly known as:  ZOFRAN ODT  Dissolve tab on the tongue every 6 hours as needed for nausea.     polyethylene glycol powder powder  Commonly known as:  GLYCOLAX/MIRALAX  Once daily  ranitidine 300 MG tablet  Commonly known as:  ZANTAC  Take 300 mg by mouth 2 (two) times daily.     saccharomyces boulardii 250 MG capsule  Commonly known as:  FLORASTOR  Take 1 capsule (250 mg total) by mouth 2 (two) times daily.     sulfamethoxazole-trimethoprim 800-160 MG per tablet  Commonly known as:  BACTRIM DS  Take one tablet daily for urinary infection         DATA REVIEWED  Radiologic Exams:   Cardiovascular Exams:   Laboratory Studies: Lab Results  Component Value Date   WBC 11.0* 09/09/2013   HGB 13.8 09/09/2013   HCT 40.3 09/09/2013   MCV 89.6 09/09/2013   PLT 318 09/09/2013   Lab Results  Component Value Date   NA 136 09/15/2013   K 4.5 09/15/2013   BUN 15 09/15/2013   CREATININE 0.9 09/15/2013   Lab Results  Component Value Date   CALCIUM 9.4 09/15/2013   ALBUMIN 3.7 10/18/2013   AST 29 10/18/2013    ALT 29 10/18/2013   ALKPHOS 94 10/18/2013   BILITOT 0.6 10/18/2013   GFRNONAA 48* 01/06/2012   GFRAA 55* 01/06/2012   No results found for this basename: vitaminb12, vitamind     REVIEW OF SYSTEMS  DATA OBTAINED: from patient, GENERAL: Feels terrible.  No recent fever. Is fatigued, has no appetite, has lost weight  RESPIRATORY: No cough, wheezing, SOB CARDIAC: No chest pain, palpitations. No edema GI: Abdominal pain, Nausea. No vomiting, does 'heave". Recent constipation, better w/Miralax. LBM today.   MUSCULOSKELETAL: Bilateral knee pain, unchanged.   Feels weak but able to get around OK at home. No recent falls  NEUROLOGIC: No dizziness, fainting, headache.  PSYCHIATRIC: Feels anxious, alone.  Sleeps poorly   No behavior issue   PHYSICAL EXAM Filed Vitals:   12/07/13 1149  BP: 140/62  Pulse: 74  Temp: 96.9 F (36.1 C)  TempSrc: Oral  Weight: 143 lb (64.864 kg)   Body mass index is 27.93 kg/(m^2).  GENERAL APPEARANCE: Appears ill, is alert, conversant, has difficulty with details and medication names. Marland Kitchen  SKIN: No diaphoresis, rash, HEAD: Normocephalic, atraumatic EYES: Conjunctiva/lids clear  RESPIRATORY: Breathing is even, unlabored  Lung sounds are clear and full  CARDIOVASCULAR: Heart RRR   No murmur or extra heart sounds   EDEMA: No peripheral edema   GASTROINTESTINAL: Abdomen is soft, non-tender w/ gentle papation, not distended w/ normal bowel sounds.  PSYCHIATRIC: Mood and affect are depressed.    ASSESSMENT/PLAN  Neuroendocrine tumor of pancreas Metastatic well differentiated neuroendocrine tumor involving a liver biopsy 09/09/2013. Tail of the pancreas mass on MRI 09/15/2013. Evaluated by Dr. Benay Spice at oncology. No curative therapy available, recommends focus on symptom management. Chief symptoms have been nausea and fatigue. Dr. Hilarie Fredrickson of gastroenterology has been working with patient to manage nausea. Initially Dexilant was helping her nausea, lately however,  patient feels she's not getting any relief despite Dexilant, metoclopramide, Zofran, Zantac. Promethazine was prescribed but it isn't clear whether she actually took this medicine. If she has not, recommend starting low-dose at bedtime.  The patient is nauseous "all the time", has abdominal pain. Is not eating well, has experienced weight loss, is not sleeping well. Discussed hospice care with the patient today. She is having difficulty with symptom management and also displays anxiety and depression regarding her diagnosis and future. She likely will benefit from the support, symptom management and education available with hospice team services. Although this tumor is classified as  low-grade and patient has been told that slow growing, she is having significant symptoms and has continued on this trajectory I would not be surprised if she survives less than 6 months. Patient agrees to referral to Hospice and Palliative care services of  [place].  UTI (urinary tract infection) Urinary urgency and dysuria 2/19, sample sent for analysis returned positive for infection, Antibiotic RX 2/20, patietn started this med. 2 days ago. No urinary symptoms today. Nausea is no better or worse since starting Bactrim.   Unspecified constipation Better with Miralax  HTN (hypertension) Stable with current medications    Follow up: As scheduled or sooner if needed  Mardene Celeste, NP-C [place] [phone]  12/07/2013

## 2013-12-07 NOTE — Assessment & Plan Note (Addendum)
Urinary urgency and dysuria 2/19, sample sent for analysis returned positive for infection, Antibiotic RX 2/20, patietn started this med. 2 days ago. No urinary symptoms today. Nausea is no better or worse since starting Bactrim.

## 2013-12-07 NOTE — Telephone Encounter (Signed)
Called patient to let her know we faxed rx for Promethazine 12.5mg  take one at bedtime #30, 3 refills to [facility]. She understood.

## 2013-12-07 NOTE — Telephone Encounter (Signed)
[place],   for referral on Heather Parker, order and demographics faxed to [phone]. They won't be able to get anyone out today, but should by tomorrow. Called patient to let her know. She was ok with this.

## 2013-12-07 NOTE — Assessment & Plan Note (Signed)
Better with Miralax

## 2013-12-07 NOTE — Assessment & Plan Note (Addendum)
Stable with current medications

## 2013-12-09 ENCOUNTER — Telehealth: Payer: Self-pay

## 2013-12-09 ENCOUNTER — Telehealth: Payer: Self-pay | Admitting: Internal Medicine

## 2013-12-09 NOTE — Telephone Encounter (Signed)
Reviewed medications and all questions answered with the patient.  She will call back for any additional questions or concerns.

## 2013-12-09 NOTE — Telephone Encounter (Signed)
Called pt and she said she already spoke to [facility].

## 2013-12-09 NOTE — Telephone Encounter (Signed)
Heather Parker from [facility] called, they spoke with Heather Parker and her daughter Heather Parker, they don't want Hospice at this time.  They will close this case at this time.

## 2013-12-12 ENCOUNTER — Encounter: Payer: Self-pay | Admitting: Internal Medicine

## 2013-12-20 ENCOUNTER — Telehealth: Payer: Self-pay

## 2013-12-20 NOTE — Telephone Encounter (Signed)
Received a call from [facility] clinic nurse, Mrs. Morss had called her to cancel her appt for tomorrow with Claudette she was too sick to come in. I called Mrs. Pinder, she is so sick, nausea and vomited once. She is too sick to get dress. Told her that she needs to come in and see Claudette tomorrow if she was that sick. We don't want her to set in her apartment that sick and not let us know. She didn't know if she could get dress, I told her that she didn't need to get dress, just have transportation to bring her, she has the first appt, we will bring her back no waiting. Called Claudette, to let her know, she suggested calling her daughter Vaughan Basta, let her know. Spoke with Vaughan Basta, she knew her mother wasn't feeling well today. She had been feeling better since she saw Claudette 2/25. Eating some better, until today. Told her I did not cancel the appt for tomorrow. She will call her mother now, and go over later today and see her. I will call Mliss Sax to have transportation to come to her apartment tomorrow morning with a wheelchair and bring her to the clinic. Vaughan Basta was very appreciated of this.

## 2013-12-21 ENCOUNTER — Non-Acute Institutional Stay: Payer: Medicare Other | Admitting: Geriatric Medicine

## 2013-12-21 ENCOUNTER — Encounter: Payer: Self-pay | Admitting: Geriatric Medicine

## 2013-12-21 VITALS — BP 160/80 | HR 60 | Temp 98.2°F | Wt 143.0 lb

## 2013-12-21 DIAGNOSIS — F329 Major depressive disorder, single episode, unspecified: Secondary | ICD-10-CM

## 2013-12-21 DIAGNOSIS — F3289 Other specified depressive episodes: Secondary | ICD-10-CM

## 2013-12-21 DIAGNOSIS — M1990 Unspecified osteoarthritis, unspecified site: Secondary | ICD-10-CM

## 2013-12-21 DIAGNOSIS — F32A Depression, unspecified: Secondary | ICD-10-CM | POA: Insufficient documentation

## 2013-12-21 DIAGNOSIS — R110 Nausea: Secondary | ICD-10-CM

## 2013-12-21 DIAGNOSIS — I10 Essential (primary) hypertension: Secondary | ICD-10-CM

## 2013-12-21 NOTE — Assessment & Plan Note (Addendum)
Persistent nausea, poor appetite. Weight is stable. Cause is multifactorial with neuroendocrine tumor as main culprit,  the patient also has history of nausea and general "stomach upset". Reviewed medications at length again, simplified medication list. Discussed strategies to help keep nausea under control including eating small amounts, especially before bed.

## 2013-12-21 NOTE — Assessment & Plan Note (Signed)
Patient is depressed and anxious regarding her cancer diagnosis as well as persistent nausea. Mirtazapine 15 mg dose too high, have recommended trying 7.5 mg at bedtime to help treat symptoms of depression, anxiety, to help sleep and possibly improve appetite

## 2013-12-21 NOTE — Assessment & Plan Note (Signed)
Blood pressure remains adequately controlled on current medication

## 2013-12-21 NOTE — Assessment & Plan Note (Signed)
Patient feels believe is a better pain relief her for her chronic back pain and acetaminophen. Reminded patient that Aleve can irritate the GI tract, again stressed importance of having something in her stomach especially at night.

## 2013-12-21 NOTE — Progress Notes (Signed)
Patient ID: WATEEN VARON, female   DOB: 06-Oct-1922, 80 y.o.   MRN: [id number]   [facility] [phone])  Code Status: Full Code      Contact Information   Name Relation Home Work [place] Daughter [phone]         Chief Complaint  Patient presents with  . Medical Managment of Chronic Issues    nausea    HPI: This is a 80 y.o. female resident of [place]  section evaluated today for management of ongoing medical issues.   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 Tail of the pancreas mass on MRI 09/15/2013. Evaluated by Dr. Benay Spice at oncology. No curative therapy available, recommends focus on symptom management. Chief symptoms have been nausea and fatigue. Dr. Hilarie Fredrickson of gastroenterology has been working with patient to manage nausea. Initially Dexilant was helping her nausea, lately however, patient feels she's not getting any relief despite Dexilant, metoclopramide, Zofran, Zantac. Promethazine was prescribed but it isn't clear whether she actually took this medicine. If she has not, recommend starting low-dose at bedtime.  The patient is nauseous "all the time", has abdominal pain. Is not eating well, has experienced weight loss, is not sleeping well. Discussed hospice care with the patient today. She is having difficulty with symptom management and also displays anxiety and depression regarding her diagnosis and future. She likely will benefit from the support, symptom management and education available with hospice team services. Although this tumor is classified as low-grade and patient has been told that slow growing, she is having significant symptoms and has continued on this trajectory I would not be surprised if she survives less than 6 months. Patient agrees to referral to Hospice and Palliative care  services of  [place].  UTI (urinary tract infection) Urinary urgency and dysuria 2/19, sample sent for analysis returned positive for infection, Antibiotic RX 2/20, patietn started this med. 2 days ago. No urinary symptoms today. Nausea is no better or worse since starting Bactrim.   Unspecified constipation Better with Miralax  HTN (hypertension) Stable with current medications  Since last visit patient met with Hospice services and decided she wasn't ready at this point. Patient did have improvement in her nausea symptoms, some days better than others. Patient did not start taking Phenergan at bedtime as directed; she did not find medication at home and pharmacy told her it was too soon to fill it. Earlier this week patient had a good day on Sunday and Monday, felt so well Monday evening she attended a play. She was not hungry at dinnertime, did not eat Monday at dinner. Awoke during the night very nauseous, attempted to take some Zofran, gagged on the pill, did not take any other medication. The following day she was severely nauseous the entire day, did not eat, did not take any medication. Nursing supervisor and was called in the evening recommended Phenergan, Rx was filled. Relieved nausea, patient slept all night. This morning patient awoke ate a very small breakfast and developed some nausea. She was able to take Zantac, Zofran, as well as blood pressure medication prior to appointment. She currently is feeling fair. Took mirtazapine 43m, slept 15hours, when awoke could not swallow. Has not taken since. Completed Bactrim for UTI, no urinary sx.  Patient tells me she is not taking acetaminophen for back pian, has been taking Aleve twice daily "works better".   Allergies  Allergen Reactions  . Ciprofloxacin Nausea Only    headaches  . Codeine Nausea And Vomiting  . Darvocet [Propoxyphene N-Acetaminophen] Other (See Comments)    Caused her to pass out  . Darvon Other (See Comments)     Pt unsure of reaction  . Protonix [Pantoprazole] Other (See Comments)    Dizziness and headaches       Medication List       This list is accurate as of: 12/21/13  9:52 AM.  Always use your most recent med list.               acetaminophen 500 MG tablet  Commonly known as:  TYLENOL  Take 1 tablet (500 mg total) by mouth 2 (two) times daily. Take one tablet twice daily to reduce back pain     BYSTOLIC 10 MG tablet  Generic drug:  nebivolol  TAKE  ONE TABLET BY MOUTH DAILY TO TREAT HIGH BLOOD PRESSURE     dexlansoprazole 60 MG capsule  Commonly known as:  DEXILANT  Take 1 capsule (60 mg total) by mouth daily. (Samples from Dr. Hilarie Fredrickson)     metoCLOPramide 5 MG tablet  Commonly known as:  REGLAN  Take 1 tablet (5 mg total) by mouth 4 (four) times daily.     mirtazapine 7.5 MG tablet  Commonly known as:  REMERON  Take 7.5 mg by mouth at bedtime. Take 1 tablets (7.33m)  by mouth at bedtime to help depression, anxiety and sleep     naproxen sodium 220 MG tablet  Commonly known as:  ANAPROX  Take 220 mg by mouth 2 (two) times daily with a meal.     NIFEdipine 30 MG 24 hr tablet  Commonly known as:  PROCARDIA-XL/ADALAT CC  Take 30 mg by mouth every morning.     ondansetron 4 MG disintegrating tablet  Commonly known as:  ZOFRAN ODT  Dissolve tab on the tongue every 6 hours as needed for nausea.     polyethylene glycol powder powder  Commonly known as:  GLYCOLAX/MIRALAX  Once daily     promethazine 12.5 MG tablet  Commonly known as:  PHENERGAN  Take 12.5 mg by mouth every 8 (eight) hours as needed for nausea. Take one tablet every 8 hours as needed to reduce nausea     ranitidine 300 MG tablet  Commonly known as:  ZANTAC  Take 300 mg by mouth 2 (two) times daily.         DATA REVIEWED  Radiologic Exams:   Cardiovascular Exams:   Laboratory Studies: Lab Results  Component Value Date   WBC 11.0* 09/09/2013   HGB 13.8 09/09/2013   HCT 40.3 09/09/2013   MCV  89.6 09/09/2013   PLT 318 09/09/2013   Lab Results  Component Value Date   NA 136 09/15/2013   K 4.5 09/15/2013   BUN 15 09/15/2013   CREATININE 0.9 09/15/2013   Lab Results  Component Value Date   CALCIUM 9.4 09/15/2013   ALBUMIN 3.7 10/18/2013   AST 29 10/18/2013   ALT 29 10/18/2013   ALKPHOS 94 10/18/2013   BILITOT 0.6 10/18/2013   GFRNONAA 48* 01/06/2012   GFRAA 55* 01/06/2012   No results found for this basename: vitaminb12,  vitamind    REVIEW OF SYSTEMS  DATA OBTAINED: from patient, GENERAL: Feels "OK"  No recent fever. Is fatigued, has no appetite, no weight chnage RESPIRATORY: No cough, wheezing, SOB CARDIAC: No chest pain, palpitations. No edema GI: Abdominal pain, Nausea.  No vomiting, does 'heave". No recent constipation, using Miralax.    MUSCULOSKELETAL: Bilateral knee pain, unchanged, using Aleve.   Feels weak but able to get around OK at home. No recent falls  NEUROLOGIC: No dizziness, fainting, headache.  PSYCHIATRIC: Feels anxious, alone.  Sleeps poorly   No behavior issue   PHYSICAL EXAM Filed Vitals:   12/21/13 0854  BP: 160/80  Pulse: 60  Temp: 98.2 F (36.8 C)  TempSrc: Oral  Weight: 143 lb (64.864 kg)   Body mass index is 27.93 kg/(m^2).  GENERAL APPEARANCE: No acute distress, appropriately groomed, normal body habitus Alert, pleasant, conversant. SKIN: No diaphoresis, rash, HEAD: Normocephalic, atraumatic EYES: Conjunctiva/lids clear  RESPIRATORY: Breathing is even, unlabored  Lung sounds are clear and full  CARDIOVASCULAR: Heart RRR   No murmur or extra heart sounds   EDEMA: No peripheral edema   GASTROINTESTINAL: Abdomen is soft, non-tender w/ gentle papation, not distended w/ normal bowel sounds.  PSYCHIATRIC: Mood and affect are depressed.    ASSESSMENT/PLAN  Nausea alone Persistent nausea, poor appetite. Weight is stable. Cause is multifactorial with neuroendocrine tumor as main culprit,  the patient also has history of nausea and general  "stomach upset". Reviewed medications at length again, simplified medication list. Discussed strategies to help keep nausea under control including eating small amounts, especially before bed.   Depression Patient is depressed and anxious regarding her cancer diagnosis as well as persistent nausea. Mirtazapine 15 mg dose too high, have recommended trying 7.5 mg at bedtime to help treat symptoms of depression, anxiety, to help sleep and possibly improve appetite  Osteoarthritis Patient feels believe is a better pain relief her for her chronic back pain and acetaminophen. Reminded patient that Aleve can irritate the GI tract, again stressed importance of having something in her stomach especially at night.  HTN (hypertension) Blood pressure remains adequately controlled on current medication   Time: 40 minutes, >50% spent counseling/or care coordination  Follow up:  Return in about 1 month (around 01/21/2014) for Nausea, depression.   Mardene Celeste, NP-C [place] [phone]  12/21/2013

## 2013-12-22 ENCOUNTER — Encounter: Payer: Self-pay | Admitting: Internal Medicine

## 2014-01-05 ENCOUNTER — Encounter: Payer: Self-pay | Admitting: Internal Medicine

## 2014-01-10 ENCOUNTER — Encounter: Payer: Self-pay | Admitting: Internal Medicine

## 2014-01-10 ENCOUNTER — Ambulatory Visit (INDEPENDENT_AMBULATORY_CARE_PROVIDER_SITE_OTHER): Payer: Medicare Other | Admitting: Internal Medicine

## 2014-01-10 VITALS — BP 134/70 | HR 88 | Ht 60.0 in | Wt 144.8 lb

## 2014-01-10 DIAGNOSIS — C7A098 Malignant carcinoid tumors of other sites: Secondary | ICD-10-CM

## 2014-01-10 DIAGNOSIS — R932 Abnormal findings on diagnostic imaging of liver and biliary tract: Secondary | ICD-10-CM

## 2014-01-10 DIAGNOSIS — R110 Nausea: Secondary | ICD-10-CM

## 2014-01-10 DIAGNOSIS — D3A8 Other benign neuroendocrine tumors: Secondary | ICD-10-CM

## 2014-01-10 MED ORDER — DEXLANSOPRAZOLE 60 MG PO CPDR: 60.0000 mg | DELAYED_RELEASE_CAPSULE | Freq: Every day | ORAL | Status: DC

## 2014-01-10 MED ORDER — MIRTAZAPINE 7.5 MG PO TABS: 7.5000 mg | ORAL_TABLET | Freq: Every day | ORAL | Status: DC

## 2014-01-10 MED ORDER — ONDANSETRON 4 MG PO TBDP: ORAL_TABLET | ORAL | Status: DC

## 2014-01-10 NOTE — Patient Instructions (Addendum)
Discontinue taking Phenergan.   We have sent the following medications to your pharmacy for you to pick up at your convenience We have sent Zofran to your pharamcy: you can take this for nausea or if you are feeling queasy.  Take Dexilant every day.   Take mirtazapine (REMERON) 7.5 MG tablet every evening before bed.  Take ranitidine (ZANTAC) 300 MG tablet twice a day.   Follow up with Dr. Hilarie Fredrickson in office in 3 months

## 2014-01-10 NOTE — Progress Notes (Signed)
Subjective:    Patient ID: Heather Parker, female    DOB: 1922-07-14, 80 y.o.   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 She is here alone today. She reports overall she is doing well though she continues to have fatigue and low energy levels. She does feel somewhat down in her mood, but she says she is trying. She feels that she is eating better but she keeps "a stomach ache" occasionally it is turning in the nausea but less than before. She is moving her bowels without significant constipation. She tried Senokot without much benefit and so she uses MiraLax as needed. She reports if she "right" she doesn't have a problem with constipation. She is taking Dexilant 60 mg daily and ranitidine at bedtime. She wonders if she can also take ranitidine in the morning. After last visit I had discontinued promethazine because of some confusion, but it seems she has restarted taking this 12.5 mg each night. She feels she is tolerating it well. She tried mirtazapine was started after her last visit to help with mood, appetite, and sleep. She took this one time at 15 mg and reports she slept 15 hours and woke up at trouble swallowing. She called her nurse who sat with her and after about 10 minutes she was able to swallow again. No abdominal pain other than her "uneasy stomach". No fevers or chills. No new rashes. Appetite she feels like may have improved in that time she is quite hungry but she is somewhat worried about gaining weight. She says she's gained about 1 pound  Review of Systems As per history of present illness, otherwise negative  Current Medications, Allergies, Past Medical History, Past Surgical History, Family History and Social History were reviewed in [facility] record.     Objective:   Physical Exam BP 134/70  Pulse 88  Ht 5' (1.524 m)  Wt 144 lb 12.8 oz (65.681 kg)  BMI 28.28 kg/m2 Constitutional: Well-developed and well-nourished. No distress. HEENT: Normocephalic and atraumatic. Conjunctivae are normal.  No scleral icterus. Cardiovascular: Normal rate, regular rhythm and intact distal pulses. Pulmonary/chest: Effort normal and breath sounds normal. No wheezing, rales or rhonchi. Abdominal: Soft, nontender, nondistended. Bowel sounds active throughout. Extremities: no clubbing, cyanosis, or edema Neurological: Alert and oriented to person place and time. Skin: Skin is warm and dry. No rashes noted. Psychiatric: Normal mood and affect. Behavior is normal.      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  1. metastatic pancreatic neuroendocrine tumor -- overall she is doing fairly well today. I do think the mirtazapine at 15 mg was too high a dose. I also feel that promethazine did cause some disorientation which she is misremembering. I would like her to discontinue promethazine altogether and try mirtazapine 7.5 mg at bedtime. If she has further trouble swallowing with this medication she is to stop it and notify me. She voices understanding. Hopefully this will help some with her mood and also appetite. She will continue Dexilant 60 mg daily and I have given her permission to use ranitidine 150 mg twice daily  if necessary. She is only using Zofran for true nausea, but I have asked her to use this more frequently for "uneasy" stomach, which she says it has helped in the past. I have reminded her that we are treating symptoms and trying to make her symptoms as tolerable as possible. She voices appreciation for my time and looks  forward to her visits here. She does followup with a PA at her living facility every 6 weeks. We'll see her back in 2-3 months, sooner if necessary. I asked that she call me with any questions or concerns and she voices understanding. Samples of Dexilant given today.

## 2014-01-16 ENCOUNTER — Telehealth: Payer: Self-pay | Admitting: Internal Medicine

## 2014-01-16 NOTE — Telephone Encounter (Signed)
I would like to know if this pain is new for her, or more likely what we talked about in clinic last week Any associated worsening nausea or vomiting? Trial of hydrocodone 5/325 one tablet every 6 hours as needed. Please let her know that this can cause somnolence, confusion and increased risk of falling. But this is relatively low dose for pain

## 2014-01-16 NOTE — Telephone Encounter (Signed)
Patient reports continuous abdominal pain. She is requesting something for pain.  Please advise

## 2014-01-16 NOTE — Telephone Encounter (Signed)
I called patient back.  She states it is the same feeling from last week.  She states there is always nausea.  She wants to wait to pick up rx.  She feels that the pain today is related to what she had to eat yesterday at [facility] dinner.  She wants to try altering her diet.  She is advised that if she changes her mind and wants the prescription to call me back

## 2014-01-25 ENCOUNTER — Encounter: Payer: Self-pay | Admitting: Geriatric Medicine

## 2014-01-25 ENCOUNTER — Non-Acute Institutional Stay: Payer: Medicare Other | Admitting: Geriatric Medicine

## 2014-01-25 VITALS — BP 130/80 | HR 80 | Wt 142.0 lb

## 2014-01-25 DIAGNOSIS — Z0000 Encounter for general adult medical examination without abnormal findings: Secondary | ICD-10-CM

## 2014-01-25 DIAGNOSIS — F329 Major depressive disorder, single episode, unspecified: Secondary | ICD-10-CM

## 2014-01-25 DIAGNOSIS — R5381 Other malaise: Secondary | ICD-10-CM

## 2014-01-25 DIAGNOSIS — R110 Nausea: Secondary | ICD-10-CM

## 2014-01-25 DIAGNOSIS — F32A Depression, unspecified: Secondary | ICD-10-CM

## 2014-01-25 DIAGNOSIS — F3289 Other specified depressive episodes: Secondary | ICD-10-CM

## 2014-01-25 NOTE — Progress Notes (Signed)
Patient ID: Heather Parker, female   DOB: 1921/11/30, 80 y.o.   MRN: [id number]   [facility] [phone])  Code Status: Full Code      Contact Information   Name Relation Home Work [place] Daughter [phone]         Chief Complaint  Patient presents with  . Medical Managment of Chronic Issues    nausea, depression,   . Insomnia    can't sleep at night, goes to bed at 9:00 doesn't go to sleep til 1. Patient thinks the Remeron is keeping her awake, then sleeply durning the day and takes naps.    HPI: This is a 80 y.o. female resident of [place]  section evaluated today for management of ongoing medical issues.   Last visit: Nausea alone Persistent nausea, poor appetite. Weight is stable. Cause is multifactorial with neuroendocrine tumor as main culprit,  the patient also has history of nausea and general "stomach upset". Reviewed medications at length again, simplified medication list. Discussed strategies to help keep nausea under control including eating small amounts, especially before bed.   Depression Patient is depressed and anxious regarding her cancer diagnosis as well as persistent nausea. Mirtazapine 15 mg dose too high, have recommended trying 7.5 mg at bedtime to help treat symptoms of depression, anxiety, to help sleep and possibly improve appetite  Osteoarthritis Patient feels believe is a better pain relief her for her chronic back pain and acetaminophen. Reminded patient that Aleve can irritate the GI tract, again stressed importance of having something in her stomach especially at night.  HTN (hypertension) Blood pressure remains adequately controlled on current medication    Since last visit patient reports nausea has been better controlled with use of Zofran every 6 hours. Reports her stomach always hurts. Is taking  Reglan twice a day, Naprosyn twice daily. Her main complaint is  that she can't sleep at night, thinks the Remeron is keeping her awake. Also asks about physical therapy due to generalized weakness. Also complains of discomfort on her bottom.  Allergies  Allergen Reactions  . Ciprofloxacin Nausea Only    headaches  . Codeine Nausea And Vomiting  . Darvocet [Propoxyphene N-Acetaminophen] Other (See Comments)    Caused her to pass out  . Darvon Other (See Comments)    Pt unsure of reaction  . Protonix [Pantoprazole] Other (See Comments)    Dizziness and headaches       Medication List       This list is accurate as of: 01/25/14 10:09 AM.  Always use your most recent med list.               acetaminophen 500 MG tablet  Commonly known as:  TYLENOL  Take 1 tablet (500 mg total) by mouth 2 (two) times daily. Take one tablet twice daily to reduce back pain     BYSTOLIC 10 MG tablet  Generic drug:  nebivolol  TAKE  ONE TABLET BY MOUTH DAILY TO TREAT HIGH BLOOD PRESSURE     dexlansoprazole 60 MG capsule  Commonly known as:  DEXILANT  Take 1 capsule (60 mg total) by mouth daily. (Samples from Dr. Hilarie Fredrickson)     metoCLOPramide 5 MG tablet  Commonly known as:  REGLAN  Take 1 tablet (5 mg total) by mouth 4 (four) times daily.     mirtazapine 7.5 MG tablet  Commonly known as:  REMERON  Take 1 tablet (7.5 mg total) by  mouth at bedtime. Take 1 tablets (7.5mg )  by mouth at bedtime to help depression, anxiety and sleep     naproxen sodium 220 MG tablet  Commonly known as:  ANAPROX  Take 220 mg by mouth 2 (two) times daily with a meal.     NIFEdipine 30 MG 24 hr tablet  Commonly known as:  PROCARDIA-XL/ADALAT CC  Take 30 mg by mouth every morning.     ondansetron 4 MG disintegrating tablet  Commonly known as:  ZOFRAN ODT  Dissolve tab on the tongue every 6 hours as needed for nausea.     polyethylene glycol powder powder  Commonly known as:  GLYCOLAX/MIRALAX  Once daily     ranitidine 300 MG tablet  Commonly known as:  ZANTAC  Take 300 mg  by mouth 2 (two) times daily.         DATA REVIEWED  Radiologic Exams:   Cardiovascular Exams:   Laboratory Studies: Lab Results  Component Value Date   WBC 11.0* 09/09/2013   HGB 13.8 09/09/2013   HCT 40.3 09/09/2013   MCV 89.6 09/09/2013   PLT 318 09/09/2013   Lab Results  Component Value Date   NA 136 09/15/2013   K 4.5 09/15/2013   BUN 15 09/15/2013   CREATININE 0.9 09/15/2013   Lab Results  Component Value Date   CALCIUM 9.4 09/15/2013   ALBUMIN 3.7 10/18/2013   AST 29 10/18/2013   ALT 29 10/18/2013   ALKPHOS 94 10/18/2013   BILITOT 0.6 10/18/2013   GFRNONAA 48* 01/06/2012   GFRAA 55* 01/06/2012   No results found for this basename: vitaminb12,  vitamind    REVIEW OF SYSTEMS  DATA OBTAINED: from patient, GENERAL: Feels "OK"  No recent fever. Is fatigued, eating a little better, no weight change RESPIRATORY: No cough, wheezing, SOB CARDIAC: No chest pain, palpitations. No edema GI: Abdominal pain, Less Nausea. No vomiting, does 'heave". No recent constipation, using Miralax.    MUSCULOSKELETAL: Bilateral knee pain, unchanged, using Aleve.   Feels weak but able to get around OK at home, would like to be more active. No recent falls  NEUROLOGIC: No dizziness, fainting, headache.  PSYCHIATRIC: Feels lass anxious, alone.  Sleeps poorly   No behavior issue   PHYSICAL EXAM Filed Vitals:   01/25/14 0934  BP: 130/80  Pulse: 80  Weight: 142 lb (64.411 kg)   Body mass index is 27.73 kg/(m^2).  GENERAL APPEARANCE: No acute distress, appropriately groomed, normal body habitus Alert, pleasant, conversant. SKIN: No diaphoresis.   Red, irritated rash over edge of both buttocks HEAD: Normocephalic, atraumatic EYES: Conjunctiva/lids clear  RESPIRATORY: Breathing is even, unlabored  Lung sounds are clear and full  CARDIOVASCULAR: Heart RRR   No murmur or extra heart sounds   EDEMA: No peripheral edema   GASTROINTESTINAL: Abdomen is soft, non-tender w/ gentle papation, not  distended w/ normal bowel sounds.  PSYCHIATRIC: Mood and affect are less depressed than last visit.    ASSESSMENT/PLAN  Nausea alone Nausea persists but is less severe than previously. She reports she is eating a little bit better. Patient feels Zofran as the medicine that is helping her the most, Reglan may be contributing to poor sleep at night, recommend decreasing dose of this medication.  Depression Patient's mood is mildly improved today, as is her appetite. I don't believe the Remeron is keeping her up at night, hopefully adjusting the dose of Reglan will help her insomnia. Continue Remeron.  Physical deconditioning Patient with decreased physical  activity due to chronic muscle skeletal pain as well as much of his last several months. She would like to try physical therapy in attempts to regain her prior level of activity.  Health care maintenance Patient agreed to undergo pharmacogenetic testing today. Once results are received, will contact patient if medication adjustments are necessary     Time: 40 minutes, >50% spent counseling/or care coordination  Follow up:  Return in about 1 month (around 02/24/2014) for OV, nausea, medication review.   Mardene Celeste, NP-C [place] [phone]  01/25/2014

## 2014-01-29 DIAGNOSIS — Z0000 Encounter for general adult medical examination without abnormal findings: Secondary | ICD-10-CM | POA: Insufficient documentation

## 2014-01-29 DIAGNOSIS — R5381 Other malaise: Secondary | ICD-10-CM | POA: Insufficient documentation

## 2014-01-29 NOTE — Assessment & Plan Note (Signed)
Patient agreed to undergo pharmacogenetic testing today. Once results are received, will contact patient if medication adjustments are necessary   

## 2014-01-29 NOTE — Assessment & Plan Note (Signed)
Nausea persists but is less severe than previously. She reports she is eating a little bit better. Patient feels Zofran as the medicine that is helping her the most, Reglan may be contributing to poor sleep at night, recommend decreasing dose of this medication.

## 2014-01-29 NOTE — Assessment & Plan Note (Signed)
Patient's mood is mildly improved today, as is her appetite. I don't believe the Remeron is keeping her up at night, hopefully adjusting the dose of Reglan will help her insomnia. Continue Remeron.

## 2014-01-29 NOTE — Assessment & Plan Note (Signed)
Patient with decreased physical activity due to chronic muscle skeletal pain as well as much of his last several months. She would like to try physical therapy in attempts to regain her prior level of activity.

## 2014-02-14 ENCOUNTER — Telehealth: Payer: Self-pay | Admitting: Internal Medicine

## 2014-02-14 NOTE — Telephone Encounter (Signed)
Patient is requesting that Dr. Hilarie Fredrickson call her.  She is notified that he is the hospital MD this week and will most likely not be able to call until next week.  She is having chronic pain and nausea.  She states that you told her to call and you would speak with her.

## 2014-02-17 ENCOUNTER — Telehealth: Payer: Self-pay | Admitting: Internal Medicine

## 2014-02-17 NOTE — Telephone Encounter (Signed)
Patient notified that Dr. Hilarie Fredrickson has the note and that he will return to the office next week

## 2014-02-21 ENCOUNTER — Telehealth: Payer: Self-pay | Admitting: Internal Medicine

## 2014-02-21 MED ORDER — HYDROCODONE-ACETAMINOPHEN 5-325 MG PO TABS: 1.0000 | ORAL_TABLET | Freq: Four times a day (QID) | ORAL | Status: DC | PRN

## 2014-02-21 NOTE — Telephone Encounter (Signed)
I spoke to Mrs. Lilia Pro by phone She is now having daily mid and epigastric abdominal pain which is more progressive than when we last spoke in clinic, the pain seems to worsen throughout the day and leads to nausea She is asking for something for pain She has known metastatic neuroendocrine tumor and this is likely the cause for her pain She would also like to see Dr. Benay Spice again if possible to discuss her cancer once more I will try her on hydrocodone-acetaminophen 5 mg every 6-8 hours as needed for pain. We discussed this medication can be sedating and even cause disorientation. I warned her about the risk for falling. Given her cancer I do think she will likely require narcotic. I asked that she take the first dose during the day and when she does not plan to be up and about so that she can see what her response is. She voiced understanding We will contact the cancer center to try to get her a followup appointment with Dr. Benay Spice Hydrocodone #20, her daughter will come by the office tomorrow to pick it up

## 2014-02-21 NOTE — Telephone Encounter (Signed)
Dr. Hilarie Fredrickson have you had a chance to call her??

## 2014-02-21 NOTE — Telephone Encounter (Signed)
rx placed out front.  Oncology is closed for the day.  I will contact them in the am

## 2014-02-22 ENCOUNTER — Non-Acute Institutional Stay: Payer: Medicare Other | Admitting: Geriatric Medicine

## 2014-02-22 ENCOUNTER — Telehealth: Payer: Self-pay | Admitting: *Deleted

## 2014-02-22 ENCOUNTER — Encounter: Payer: Self-pay | Admitting: Geriatric Medicine

## 2014-02-22 VITALS — BP 110/60 | HR 80 | Wt 135.0 lb

## 2014-02-22 DIAGNOSIS — IMO0001 Reserved for inherently not codable concepts without codable children: Secondary | ICD-10-CM

## 2014-02-22 DIAGNOSIS — F3289 Other specified depressive episodes: Secondary | ICD-10-CM

## 2014-02-22 DIAGNOSIS — F329 Major depressive disorder, single episode, unspecified: Secondary | ICD-10-CM

## 2014-02-22 DIAGNOSIS — K5900 Constipation, unspecified: Secondary | ICD-10-CM

## 2014-02-22 DIAGNOSIS — C7A098 Malignant carcinoid tumors of other sites: Secondary | ICD-10-CM

## 2014-02-22 DIAGNOSIS — M791 Myalgia, unspecified site: Secondary | ICD-10-CM

## 2014-02-22 DIAGNOSIS — D3A8 Other benign neuroendocrine tumors: Secondary | ICD-10-CM

## 2014-02-22 DIAGNOSIS — F32A Depression, unspecified: Secondary | ICD-10-CM

## 2014-02-22 NOTE — Telephone Encounter (Signed)
Gave patient appointment with Dr. Benay Spice for 5/18 at 1130. She is not able to make this appointment due to daughter being out of town. Does not want to use [facility] transporter. Feels she needs to have her daughter present for the visit. Will need to be seen on 5/22 or afterwards.

## 2014-02-22 NOTE — Assessment & Plan Note (Signed)
Patient continues with nausea, abdominal pain, poor appetite, weight loss. Unclear how much is re: cancer vs. Constipation. Agree with return to oncology.

## 2014-02-22 NOTE — Assessment & Plan Note (Signed)
Stool seepage, hard passage of stool , infrequent BM all c/w constipation. Since she has not been using Miralax consistently, have encouraged her to take Miralax every morning. Counseled that she may experience multiple soft stools. Perianal skin irritation will improve when stool seepage stops, until then have instructed to check/ change pad every 2 hours.

## 2014-02-22 NOTE — Telephone Encounter (Signed)
I have contacted Oncology- Dr. Gearldine Shown office for an appt.  I spoke with the triage nurse.  She will forward a note to Dr. Benay Spice for an appt

## 2014-02-22 NOTE — Progress Notes (Signed)
Patient ID: Heather Parker, female   DOB: 01/10/1922, 80 y.o.   MRN: [id number]   [facility] [phone])  Code Status: Full Code      Contact Information   Name Relation Home Work [place] Daughter [phone]         Chief Complaint  Patient presents with  . Medical Management of Chronic Issues    nausea, depression    HPI: This is a 80 y.o. female resident of [facility]  section evaluated today for management of ongoing medical issues.   Last visit: Nausea alone Nausea persists but is less severe than previously. She reports she is eating a little bit better. Patient feels Zofran as the medicine that is helping her the most, Reglan may be contributing to poor sleep at night, recommend decreasing dose of this medication. Depression Patient's mood is mildly improved today, as is her appetite. I don't believe the Remeron is keeping her up at night, hopefully adjusting the dose of Reglan will help her insomnia. Continue Remeron. Physical deconditioning Patient with decreased physical activity due to chronic muscle skeletal pain as well as much of his last several months. She would like to try physical therapy in attempts to regain her prior level of activity. Health care maintenance Patient agreed to undergo pharmacogenetic testing today. Once results are received, will contact patient if medication adjustments are necessary   Since last visit patient reports she has been experiencing increasing abdominal pain and nausea. "I have nausea and pain all the time, never without discomfort". She called Dr. Vena Rua office yesterday, he has prescribed medication, daughter Vaughan Basta is picking up the prescription today. Patient doesn't know what medication he is. Dr. Hilarie Fredrickson has also recommended return to oncology. In Epic chart review it appears oncology office will be contacting this patient to arrange an appointment  with Ned Card. It also appears that the medication Dr.Pyrtle has prescribed is hydrocodone. Patient requested starting physical therapy, this began last week. Initially she felt very good walk that distance and did some exercises. After that she had significant pain in her bottom on the right side. This pain is making ambulation somewhat difficult. She is continuing with therapy. Patient's other significant plate complaint is pain on her bottom..feels raw.    Allergies  Allergen Reactions  . Ciprofloxacin Nausea Only    headaches  . Codeine Nausea And Vomiting  . Darvocet [Propoxyphene N-Acetaminophen] Other (See Comments)    Caused her to pass out  . Darvon Other (See Comments)    Pt unsure of reaction  . Protonix [Pantoprazole] Other (See Comments)    Dizziness and headaches       Medication List       This list is accurate as of: 02/22/14  9:22 AM.  Always use your most recent med list.               acetaminophen 500 MG tablet  Commonly known as:  TYLENOL  Take 1 tablet (500 mg total) by mouth 2 (two) times daily. Take one tablet twice daily to reduce back pain     BYSTOLIC 10 MG tablet  Generic drug:  nebivolol  TAKE  ONE TABLET BY MOUTH DAILY TO TREAT HIGH BLOOD PRESSURE     dexlansoprazole 60 MG capsule  Commonly known as:  DEXILANT  Take 1 capsule (60 mg total) by mouth daily. (Samples from Dr. Hilarie Fredrickson)     HYDROcodone-acetaminophen 5-325 MG per tablet  Commonly  known as:  NORCO/VICODIN  Take 1 tablet by mouth every 6 (six) hours as needed for moderate pain.     metoCLOPramide 5 MG tablet  Commonly known as:  REGLAN  Take 1 tablet (5 mg total) by mouth 4 (four) times daily.     mirtazapine 7.5 MG tablet  Commonly known as:  REMERON  Take 1 tablet (7.5 mg total) by mouth at bedtime. Take 1 tablets (7.5mg )  by mouth at bedtime to help depression, anxiety and sleep     naproxen sodium 220 MG tablet  Commonly known as:  ANAPROX  Take 220 mg by mouth 2 (two)  times daily with a meal.     NIFEdipine 30 MG 24 hr tablet  Commonly known as:  PROCARDIA-XL/ADALAT CC  Take 30 mg by mouth every morning.     ondansetron 4 MG disintegrating tablet  Commonly known as:  ZOFRAN ODT  Dissolve tab on the tongue every 6 hours as needed for nausea.     polyethylene glycol powder powder  Commonly known as:  GLYCOLAX/MIRALAX  Once daily     ranitidine 300 MG tablet  Commonly known as:  ZANTAC  Take 300 mg by mouth 2 (two) times daily.         DATA REVIEWED  Radiologic Exams:   Cardiovascular Exams:   Laboratory Studies: Lab Results  Component Value Date   WBC 11.0* 09/09/2013   HGB 13.8 09/09/2013   HCT 40.3 09/09/2013   MCV 89.6 09/09/2013   PLT 318 09/09/2013   Lab Results  Component Value Date   NA 136 09/15/2013   K 4.5 09/15/2013   BUN 15 09/15/2013   CREATININE 0.9 09/15/2013   Lab Results  Component Value Date   CALCIUM 9.4 09/15/2013   ALBUMIN 3.7 10/18/2013   AST 29 10/18/2013   ALT 29 10/18/2013   ALKPHOS 94 10/18/2013   BILITOT 0.6 10/18/2013   GFRNONAA 48* 01/06/2012   GFRAA 55* 01/06/2012   No results found for this basename: vitaminb12,  vitamind    REVIEW OF SYSTEMS  DATA OBTAINED: from patient, GENERAL: Feels "terrible"  No recent fever. Is fatigued, very poor appetite, more weight loss RESPIRATORY: No cough, wheezing, SOB CARDIAC: No chest pain, palpitations. No edema GI: Abdominal pain, Nausea. No vomiting. Stool is hard to pass, BM "every 3-4 days"using Miralax "sometimes".    MUSCULOSKELETAL: Indicates pain posterior right buttock, thigh. Lower back No recent falls  NEUROLOGIC: No dizziness, fainting, headache.  PSYCHIATRIC:  Sleeping a little better   No behavior issue   PHYSICAL EXAM Filed Vitals:   02/22/14 0945  BP: 110/60  Pulse: 80  Weight: 135 lb (61.236 kg)   Body mass index is 26.37 kg/(m^2).  GENERAL APPEARANCE: No acute distress, appropriately groomed, normal body habitus Alert, pleasant,  conversant. SKIN: No diaphoresis.   HEAD: Normocephalic, atraumatic EYES: Conjunctiva/lids clear  RESPIRATORY: Breathing is even, unlabored  Lung sounds are clear and full  CARDIOVASCULAR: Heart RRR   No murmur or extra heart sounds   EDEMA: No peripheral edema   GASTROINTESTINAL: Abdomen is soft, non-tender w/ gentle papation, not distended w/ decreased bowel sounds.   RECTAL: Evidence of stool seepage on pad, perianal skin is red, irritated, one small open area PSYCHIATRIC: Mood and affect are depressed than last visit. Has difficulty with details. Is very discouraged due to persistence of GI symptoms   ASSESSMENT/PLAN  Unspecified constipation Stool seepage, hard passage of stool , infrequent BM all c/w constipation. Since she  has not been using Miralax consistently, have encouraged her to take Miralax every morning. Counseled that she may experience multiple soft stools. Perianal skin irritation will improve when stool seepage stops, until then have instructed to check/ change pad every 2 hours.   Depression Mood is very low, patient is very discouraged about persistence of GI symptoms "I don't understand...". I have some concern about her self care abilities (medication, bowels, skin care). Rehab admission was suggested by Clinic nurse yesterday- patient declined. Do not recommend medication change at this time. If her symptoms improve, so will her mood.   Neuroendocrine tumor of pancreas Patient continues with nausea, abdominal pain, poor appetite, weight loss. Unclear how much is re: cancer vs. Constipation. Agree with return to oncology.   Muscle pain Pain posterior right leg/buttock likley due to sudden use with ambulation / exercise. Reports magge of the area was beneficial. She is already taking naprosyn, add Arnica Gel 2-4 times daily, instructed on gentle stretching exercise.   Time: 40 minutes, >50% spent counseling/or care coordination  Follow up:  Return in about 3 years  (around 02/22/2017) for Constipation, depression.   Mardene Celeste, NP-C [place] [phone]  02/22/2014

## 2014-02-22 NOTE — Assessment & Plan Note (Signed)
Mood is very low, patient is very discouraged about persistence of GI symptoms "I don't understand...". I have some concern about her self care abilities (medication, bowels, skin care). Rehab admission was suggested by Clinic nurse yesterday- patient declined. Do not recommend medication change at this time. If her symptoms improve, so will her mood.

## 2014-02-22 NOTE — Telephone Encounter (Signed)
Patient having more abdominal pain and nausea. Dr. Hilarie Fredrickson asking Dr. Benay Spice to see her. They did provide script for Hydrocodone and she is on Reglan qid.  Last seen by Ned Card 10/20/13-neuroendocrine tumor. Resides in an assisted living facility-can reach her at [phone].

## 2014-02-22 NOTE — Telephone Encounter (Signed)
Per Dr. Benay Spice : His 1st available is June 5th. Can be seen 5/29 at 1:45 (60 minute visit) by Ned Card. Left this on her home voice mail with request to call triage nurse tomorrow with her wishes.

## 2014-02-22 NOTE — Assessment & Plan Note (Signed)
Pain posterior right leg/buttock likley due to sudden use with ambulation / exercise. Reports magge of the area was beneficial. She is already taking naprosyn, add Arnica Gel 2-4 times daily, instructed on gentle stretching exercise.

## 2014-02-22 NOTE — Telephone Encounter (Signed)
Dr. Hilarie Fredrickson spoke with the patient by phone last night. See phone note from 02/17/14 for additional details

## 2014-02-23 ENCOUNTER — Telehealth: Payer: Self-pay | Admitting: Internal Medicine

## 2014-02-23 NOTE — Telephone Encounter (Signed)
Patient reports that she had terrible nausea and fainting after taking hydrocodone.  She does have a follow up with Oncology on 03/10/14, she was not able to go earlier due to conflicts with her daughter's schedule. She wants you to know that she is having terrible nausea.

## 2014-02-23 NOTE — Telephone Encounter (Signed)
Called Heather Parker back and she agrees to see Ned Card on 03/10/14 at 1:45 pm. She was able to write down and read appointment back to nurse. Her daughter will bring her.

## 2014-02-24 ENCOUNTER — Encounter: Payer: Self-pay | Admitting: Geriatric Medicine

## 2014-02-24 ENCOUNTER — Telehealth: Payer: Self-pay | Admitting: Internal Medicine

## 2014-02-24 ENCOUNTER — Non-Acute Institutional Stay (SKILLED_NURSING_FACILITY): Payer: Medicare Other | Admitting: Geriatric Medicine

## 2014-02-24 DIAGNOSIS — C7A098 Malignant carcinoid tumors of other sites: Secondary | ICD-10-CM

## 2014-02-24 DIAGNOSIS — K5900 Constipation, unspecified: Secondary | ICD-10-CM

## 2014-02-24 DIAGNOSIS — D3A8 Other benign neuroendocrine tumors: Secondary | ICD-10-CM

## 2014-02-24 DIAGNOSIS — R110 Nausea: Secondary | ICD-10-CM

## 2014-02-24 MED ORDER — ONDANSETRON 8 MG PO TBDP: 8.0000 mg | ORAL_TABLET | Freq: Three times a day (TID) | ORAL | Status: DC | PRN

## 2014-02-24 NOTE — Telephone Encounter (Signed)
Patient notified She is aware she can double her prescription she has at home until she picks up the new rx

## 2014-02-24 NOTE — Assessment & Plan Note (Signed)
Persistent nausea, full feeling. Patient is unable to be clear about how much/ what medications she has been taking. I am certain she has not been eating and drinking much at all.   Will start PIV, obtain lab. Nursing will manage medication, document I/Os. Hopefully, this will give a clearer picture of this patient's situation

## 2014-02-24 NOTE — Assessment & Plan Note (Signed)
Patient has had some loose stool as expected with resumption of Miralax. She is concerned this is too much. Will have nursing monitor I/Os.

## 2014-02-24 NOTE — Progress Notes (Signed)
Patient ID: Heather Parker, female   DOB: [birth year], 80 y.o.   MRN: [id number]   [facility] [phone])  Code Status: Full Code      Contact Information   Name Relation Home Work [place] Daughter [phone]         Chief Complaint  Patient presents with  . Nausea    HPI: This is a 80 y.o. female resident of [facility] [place]  section Admitted to the Rehab section today due to worsening nausea, poor PO intake. Her daughter provides significant support in the home and will be out of town for several days. Rehab admission was recommended observation  And assistance with medication, symptom management.  Last visit, 02/22/2014 Unspecified constipation Stool seepage, hard passage of stool , infrequent BM all c/w constipation. Since she has not been using Miralax consistently, have encouraged her to take Miralax every morning. Counseled that she may experience multiple soft stools. Perianal skin irritation will improve when stool seepage stops, until then have instructed to check/ change pad every 2 hours.  Depression Mood is very low, patient is very discouraged about persistence of GI symptoms "I don't understand...". I have some concern about her self care abilities (medication, bowels, skin care). Rehab admission was suggested by Clinic nurse yesterday- patient declined. Do not recommend medication change at this time. If her symptoms improve, so will her mood.  Neuroendocrine tumor of pancreas Patient continues with nausea, abdominal pain, poor appetite, weight loss. Unclear how much is re: cancer vs. Constipation. Agree with return to oncology.  Muscle pain Pain posterior right leg/buttock likley due to sudden use with ambulation / exercise. Reports magge of the area was beneficial. She is already taking naprosyn, add Arnica Gel 2-4 times daily, instructed on gentle stretching exercise.  Since last visit patient  received hydrocodone Rx from Dr.Pyrtle. She did not tolerate this medication "made me sick and I fainted!".  Patient requested increased dose of Zofran to help with nausea. Today, clinic nurse found 2 extra supplies of this medication in patient's home. It is unclear what medication she has actually been taking.  Oncology appointment has been arranged with Ned Card, NP 03/10/2014   Allergies  Allergen Reactions  . Ciprofloxacin Nausea Only    headaches  . Codeine Nausea And Vomiting  . Darvocet [Propoxyphene N-Acetaminophen] Other (See Comments)    Caused her to pass out  . Darvon Other (See Comments)    Pt unsure of reaction  . Protonix [Pantoprazole] Other (See Comments)    Dizziness and headaches    MEDICATION - Reviewed    DATA REVIEWED  Radiologic Exams:   Cardiovascular Exams:   Laboratory Studies: Lab Results  Component Value Date   WBC 11.0* 09/09/2013   HGB 13.8 09/09/2013   HCT 40.3 09/09/2013   MCV 89.6 09/09/2013   PLT 318 09/09/2013   Lab Results  Component Value Date   NA 136 09/15/2013   K 4.5 09/15/2013   BUN 15 09/15/2013   CREATININE 0.9 09/15/2013   Lab Results  Component Value Date   CALCIUM 9.4 09/15/2013   ALBUMIN 3.7 10/18/2013   AST 29 10/18/2013   ALT 29 10/18/2013   ALKPHOS 94 10/18/2013   BILITOT 0.6 10/18/2013   GFRNONAA 48* 01/06/2012   GFRAA 55* 01/06/2012   No results found for this basename: vitaminb12,  vitamind    REVIEW OF SYSTEMS  DATA OBTAINED: from patient, GENERAL: Feels "terrible"  No recent fever.  Is fatigued, very poor appetite, "I always feel full"  RESPIRATORY: No cough, wheezing, SOB CARDIAC: No chest pain, palpitations. No edema GI: Abdominal pain, Nausea. No vomiting. HAs had loose stool recently MUSCULOSKELETAL: Indicates pain posterior right buttock, thigh is getting better.  No recent falls  NEUROLOGIC: No dizziness, fainting, headache.  PSYCHIATRIC:  Sleeping a little better   No behavior issue   PHYSICAL  EXAM Filed Vitals:   02/24/14 1707  BP: 167/82  Pulse: 77  Temp: 97.1 F (36.2 C)  Resp: 19  SpO2: 100%   There is no weight on file to calculate BMI.  GENERAL APPEARANCE: No acute distress, appropriately groomed, normal body habitus  Alert, pleasant, conversant. SKIN: No diaphoresis.   HEAD: Normocephalic, atraumatic EYES: Conjunctiva/lids clear  NOSE: No deformity or discharge. MOUTH/THROAT: Lips w/o lesions. Oral mucosa, tongue moist, w/o lesion. Oropharynx w/o redness or lesions.  NECK: Supple, full ROM. No thyroid tenderness, enlargement or nodule LYMPHATICS: No head, neck or supraclavicular adenopathy RESPIRATORY: Breathing is even, unlabored  Lung sounds are clear and full  CARDIOVASCULAR: Heart RRR   No murmur or extra heart sounds   EDEMA: No peripheral edema   GASTROINTESTINAL: Abdomen is soft, non-tender w/ gentle papation, not distended w/ normal bowel sounds.  PSYCHIATRIC: Mood and affect are depressed  Has difficulty with details. Is very discouraged due to persistence of GI symptoms   ASSESSMENT/PLAN  Nausea alone Persistent nausea, full feeling. Patient is unable to be clear about how much/ what medications she has been taking. I am certain she has not been eating and drinking much at all.   Will start PIV, obtain lab. Nursing will manage medication, document I/Os. Hopefully, this will give a clearer picture of this patient's situation  Unspecified constipation Patient has had some loose stool as expected with resumption of Miralax. She is concerned this is too much. Will have nursing monitor I/Os.  Neuroendocrine tumor of pancreas At last visit patietn questioned whether she should consider treatment. Oncology f/u is scheduled 03/10/2014   Time:    Follow up:  No Follow-up on file.   Mardene Celeste, NP-C [place] [phone]  02/24/2014

## 2014-02-24 NOTE — Assessment & Plan Note (Signed)
At last visit patietn questioned whether she should consider treatment. Oncology f/u is scheduled 03/10/2014

## 2014-02-24 NOTE — Telephone Encounter (Signed)
Sure , 8 mg q 8 hours .# 40

## 2014-02-24 NOTE — Telephone Encounter (Signed)
Heather Parker can I send in zofran 8 mg?  She is currently on 4 mg q 6 hours?  Her appt with oncology is not until 5/28.  Please advise

## 2014-02-27 ENCOUNTER — Telehealth: Payer: Self-pay | Admitting: Internal Medicine

## 2014-02-27 LAB — CBC AND DIFFERENTIAL
HCT: 35 % — AB (ref 36–46)
HEMOGLOBIN: 11.9 g/dL — AB (ref 12.0–16.0)
PLATELETS: 310 10*3/uL (ref 150–399)
WBC: 9.3 10*3/mL

## 2014-02-27 LAB — BASIC METABOLIC PANEL
BUN: 11 mg/dL (ref 4–21)
Creatinine: 0.9 mg/dL (ref 0.5–1.1)
GLUCOSE: 120 mg/dL
Potassium: 3.6 mmol/L (ref 3.4–5.3)
Sodium: 131 mmol/L — AB (ref 137–147)

## 2014-02-27 LAB — HEPATIC FUNCTION PANEL
ALT: 25 U/L (ref 7–35)
AST: 37 U/L — AB (ref 13–35)
Alkaline Phosphatase: 119 U/L (ref 25–125)

## 2014-02-27 NOTE — Telephone Encounter (Signed)
Would discontinue hydrocodone if it worsened her nausea Would schedule Zofran 8 mg every 8 hours, she can use Phenergan for breakthrough nausea (at one point we thought this was causing side effects but at last visit with me she assured me she tolerated this medicine well) Can try oxycodone 2.5 mg for pain, again same risk associated with narcotics as we discussed by phone recently, can also cause nausea

## 2014-02-27 NOTE — Telephone Encounter (Signed)
Patient is aware See other phone note from 02/24/14.  She declines hydrocodone.

## 2014-03-01 ENCOUNTER — Telehealth: Payer: Self-pay | Admitting: *Deleted

## 2014-03-01 NOTE — Telephone Encounter (Signed)
Per Dr. Benay Spice; informed pt that it's up to her if she wants to keep MD appt 03/10/14; if symptoms better and feeling well now - OK per MD if she doesn't come.  Pt states "Dr. Benay Spice is just observing me right now and I think I'll just cancel appt and call you if I have problems."  Pt wrote down [facility] number and read back correctly; re-iterated again to call if any problems.  Pt verbalized understanding of information and expressed appreciation for call back.

## 2014-03-01 NOTE — Telephone Encounter (Signed)
Pt called reports "Dr. Levie Heritage doubled my nausea medication and I feel much better now"  Pt states she's able to eat/drink without difficultly; no nausea/vomiting "for several days now"  Pt asking does she need to keep appt for 03/10/14 "since I'm so much better?"  Note to Dr. Benay Spice.

## 2014-03-02 ENCOUNTER — Encounter: Payer: Self-pay | Admitting: Internal Medicine

## 2014-03-03 ENCOUNTER — Encounter: Payer: Self-pay | Admitting: Geriatric Medicine

## 2014-03-03 ENCOUNTER — Non-Acute Institutional Stay (SKILLED_NURSING_FACILITY): Payer: Medicare Other | Admitting: Geriatric Medicine

## 2014-03-03 DIAGNOSIS — F329 Major depressive disorder, single episode, unspecified: Secondary | ICD-10-CM

## 2014-03-03 DIAGNOSIS — F32A Depression, unspecified: Secondary | ICD-10-CM

## 2014-03-03 DIAGNOSIS — R110 Nausea: Secondary | ICD-10-CM

## 2014-03-03 DIAGNOSIS — R5381 Other malaise: Secondary | ICD-10-CM

## 2014-03-03 DIAGNOSIS — K649 Unspecified hemorrhoids: Secondary | ICD-10-CM

## 2014-03-03 DIAGNOSIS — K648 Other hemorrhoids: Secondary | ICD-10-CM

## 2014-03-03 DIAGNOSIS — F3289 Other specified depressive episodes: Secondary | ICD-10-CM

## 2014-03-03 NOTE — Telephone Encounter (Signed)
Spoke to nurse, brittney was not in, to let her know we are out of samples but they can call next week to see if we received any.

## 2014-03-03 NOTE — Progress Notes (Signed)
Patient ID: Heather Parker, female   DOB: 21-Jan-1922, 80 y.o.   MRN: [id number]   [facility] [phone])  Code Status: Full Code  Contact Information   Name Relation Home Work [place] Daughter [phone]         Chief Complaint  Patient presents with  . Nausea    Rehab discharge  . Weakness    HPI: This is a 80 y.o. female resident of [place], [place]  section Admitted to the Rehab section 02/24/14 due to worsening nausea, poor PO intake. Her daughter provides significant support in the home and was to be out of town for several days. Rehab admission was recommended for observation and assistance with medication, symptom management.  Last visit Nausea alone Persistent nausea, full feeling. Patient is unable to be clear about how much/ what medications she has been taking. I am certain she has not been eating and drinking much at all.   Will start PIV, obtain lab. Nursing will manage medication, document I/Os. Hopefully, this will give a clearer picture of this patient's situation  Unspecified constipation Patient has had some loose stool as expected with resumption of Miralax. She is concerned this is too much. Will have nursing monitor I/Os.  Neuroendocrine tumor of pancreas At last visit patietn questioned whether she should consider treatment. Oncology f/u is scheduled 03/10/2014  Since last visit, nausea has resolved, patient has been eating well. She called oncology office and cancelled appointment for 03/10/14.    Allergies  Allergen Reactions  . Ciprofloxacin Nausea Only    headaches  . Codeine Nausea And Vomiting  . Darvocet [Propoxyphene N-Acetaminophen] Other (See Comments)    Caused her to pass out  . Darvon Other (See Comments)    Pt unsure of reaction  . Protonix [Pantoprazole] Other (See Comments)    Dizziness and headaches    MEDICATION - Reviewed    DATA REVIEWED  Radiologic  Exams:   Cardiovascular Exams:   Laboratory Studies: Lab Results  Component Value Date   WBC 11.0* 09/09/2013   HGB 13.8 09/09/2013   HCT 40.3 09/09/2013   MCV 89.6 09/09/2013   PLT 318 09/09/2013   Lab Results  Component Value Date   NA 136 09/15/2013   K 4.5 09/15/2013   BUN 15 09/15/2013   CREATININE 0.9 09/15/2013   Lab Results  Component Value Date   CALCIUM 9.4 09/15/2013   ALBUMIN 3.7 10/18/2013   AST 29 10/18/2013   ALT 29 10/18/2013   ALKPHOS 94 10/18/2013   BILITOT 0.6 10/18/2013   GFRNONAA 48* 01/06/2012   GFRAA 55* 01/06/2012   No results found for this basename: vitaminb12,  vitamind    REVIEW OF SYSTEMS  DATA OBTAINED: from patient, GENERAL: Feels "very well"  No recent fever. Improved appetite,   RESPIRATORY: No cough, wheezing, SOB CARDIAC: No chest pain, palpitations. No edema GI: No Abdominal pain, Nausea, vomiting, diarrhea or constipation MUSCULOSKELETAL: Indicates pain posterior right buttock, thigh is getting better.  No recent falls  NEUROLOGIC: No dizziness, fainting, headache.  PSYCHIATRIC:  Sleeping better   No behavior issue   PHYSICAL EXAM Filed Vitals:   03/03/14 1800  BP: 160/84  Pulse: 84  Temp: 97.7 F (36.5 C)  Resp: 20  Weight: 138 lb (62.596 kg)  SpO2: 99%   Body mass index is 26.95 kg/(m^2).  GENERAL APPEARANCE: No acute distress, appropriately groomed, normal body habitus  Alert, pleasant, conversant. SKIN: No diaphoresis.  HEAD: Normocephalic, atraumatic EYES: Conjunctiva/lids clear  NOSE: No deformity or discharge. MOUTH/THROAT: Lips w/o lesions. Oral mucosa, tongue moist, w/o lesion. Oropharynx w/o redness or lesions.  NECK: Supple, full ROM. No thyroid tenderness, enlargement or nodule LYMPHATICS: No head, neck or supraclavicular adenopathy RESPIRATORY: Breathing is even, unlabored  Lung sounds are clear and full  CARDIOVASCULAR: Heart RRR   No murmur or extra heart sounds   EDEMA: No peripheral edema   GASTROINTESTINAL:  Abdomen is soft, non-tender w/ gentle papation, not distended w/ normal bowel sounds.   RECTAL: Protruding internal hemorrhoid, easily reduced. Perianal skin is healed PSYCHIATRIC: Mood and affect are not depressed, continues to have difficulty with details, specifically medication details.  ASSESSMENT/PLAN  Nausea alone Interventions (IVF, medication management) over the last week have resolved nausea. No new medication has been added, odansetron dose was not increased. Patient is eating / drinking well. Extensive discussion today with both patient and daughter re: importance of taking medication as prescribed. Daughter will provide medication assistance at home. Patient will be d/c home with simplified medication list and explicit instructions. She will f/u in clinic in 2 weeks.  Physical deconditioning Patient's physical activity level is significantly improved over the last week; she is independent with ADLs, ambulating w/ little discomfort. May be able to resume PT next week.  Hemorrhoid prolapse Prolapsed internal hemorrhoid present today, no thrombosis, no irritation,  contributes to fecal soiling. Have instructed patient on hygeine and reduction of tissue. Will f/u at clinic visit  Depression Mood is significantly improved with resolution of I sx. Patient has been taking low dose mirtazapine w/o adverse effects, continue this medicaiton   Time:  15minutes, >50% spent counseling/or care coordination   Follow up:  Return in 12 days (on 03/15/2014) for nausea, hemorrhoid, pain, medication review.   Mardene Celeste, NP-C [place] [phone]  03/03/2014

## 2014-03-07 DIAGNOSIS — K648 Other hemorrhoids: Secondary | ICD-10-CM | POA: Insufficient documentation

## 2014-03-07 NOTE — Assessment & Plan Note (Signed)
Patient's physical activity level is significantly improved over the last week; she is independent with ADLs, ambulating w/ little discomfort. May be able to resume PT next week.

## 2014-03-07 NOTE — Assessment & Plan Note (Signed)
Mood is significantly improved with resolution of I sx. Patient has been taking low dose mirtazapine w/o adverse effects, continue this medicaiton

## 2014-03-07 NOTE — Assessment & Plan Note (Addendum)
Interventions (IVF, medication management) over the last week have resolved nausea. No new medication has been added, odansetron dose was not increased. Patient is eating / drinking well. Extensive discussion today with both patient and daughter re: importance of taking medication as prescribed. Daughter will provide medication assistance at home. Patient will be d/c home with simplified medication list and explicit instructions. She will f/u in clinic in 2 weeks.

## 2014-03-07 NOTE — Assessment & Plan Note (Signed)
Prolapsed internal hemorrhoid present today, no thrombosis, no irritation,  contributes to fecal soiling. Have instructed patient on hygeine and reduction of tissue. Will f/u at clinic visit

## 2014-03-10 ENCOUNTER — Ambulatory Visit: Payer: Medicare Other | Admitting: Nurse Practitioner

## 2014-03-13 ENCOUNTER — Telehealth: Payer: Self-pay | Admitting: Internal Medicine

## 2014-03-13 NOTE — Telephone Encounter (Signed)
Patient notified that we do not have samples of Dexilant at this time.  She is asked to continue to call back and check

## 2014-03-15 ENCOUNTER — Non-Acute Institutional Stay: Payer: Medicare Other | Admitting: Geriatric Medicine

## 2014-03-15 ENCOUNTER — Ambulatory Visit
Admission: RE | Admit: 2014-03-15 | Discharge: 2014-03-15 | Disposition: A | Discharge status: Home / Self Care | Payer: Medicare Other | Source: Ambulatory Visit | Attending: Geriatric Medicine | Admitting: Geriatric Medicine

## 2014-03-15 ENCOUNTER — Encounter: Payer: Self-pay | Admitting: Geriatric Medicine

## 2014-03-15 ENCOUNTER — Telehealth: Payer: Self-pay

## 2014-03-15 VITALS — BP 114/68 | HR 60 | Temp 97.3°F | Wt 138.0 lb

## 2014-03-15 DIAGNOSIS — IMO0002 Reserved for concepts with insufficient information to code with codable children: Secondary | ICD-10-CM

## 2014-03-15 NOTE — Progress Notes (Signed)
Patient ID: Heather Parker, female   DOB: [birth year], 80 y.o.   MRN: [id number]   [facility] [phone])  Code Status: Full Code  Contact Information   Name Relation Home Work [place] Daughter [phone]         Chief Complaint  Patient presents with  . Medical Management of Chronic Issues    follow-up from discharge from Rehab for nausea, hemorrhoid pain, medication review  . Back Pain    lower back, runs down back of legs to knees, hurts to walk, for about 3 weeks, getting worse.    HPI: This is a 80 y.o. female resident of [place], [facility]  section Admitted to the Rehab section 02/24/14 due to worsening nausea, poor PO intake. She was discharged to independent living home 03/06/2014 in improved condition. Last visit Nausea alone Interventions (IVF, medication management) over the last week have resolved nausea. No new medication has been added, odansetron dose was not increased. Patient is eating / drinking well. Extensive discussion today with both patient and daughter re: importance of taking medication as prescribed. Daughter will provide medication assistance at home. Patient will be d/c home with simplified medication list and explicit instructions. She will f/u in clinic in 2 weeks. Physical deconditioning Patient's physical activity level is significantly improved over the last week; she is independent with ADLs, ambulating w/ little discomfort. May be able to resume PT next week. Hemorrhoid prolapse Prolapsed internal hemorrhoid present today, no thrombosis, no irritation,  contributes to fecal soiling. Have instructed patient on hygeine and reduction of tissue. Will f/u at clinic visit Depression Mood is significantly improved with resolution of I sx. Patient has been taking low dose mirtazapine w/o adverse effects, continue this medicaiton  Since last visit patient has not experienced nausea, she has  been able to eat pretty well. She is taking medications as directed with the assistance of her daughter who is managing the pillbox. Unfortunately this patient has experienced increased pain in her bilateral hips and lower back. She indicates this pain began after chair exercise last week. Pain is mostly in the bilateral groins but also includes lateral hips. She is having significant difficulty ambulating due to pain.    Allergies  Allergen Reactions  . Ciprofloxacin Nausea Only    headaches  . Codeine Nausea And Vomiting  . Darvocet [Propoxyphene N-Acetaminophen] Other (See Comments)    Caused her to pass out  . Darvon Other (See Comments)    Pt unsure of reaction  . Protonix [Pantoprazole] Other (See Comments)    Dizziness and headaches    MEDICATION -    Medication List       This list is accurate as of: 03/15/14 10:13 AM.  Always use your most recent med list.               BYSTOLIC 10 MG tablet  Generic drug:  nebivolol  TAKE  ONE TABLET BY MOUTH DAILY TO TREAT HIGH BLOOD PRESSURE     dexlansoprazole 60 MG capsule  Commonly known as:  DEXILANT  Take 1 capsule (60 mg total) by mouth daily. (Samples from Dr. Hilarie Fredrickson)     metoCLOPramide 5 MG tablet  Commonly known as:  REGLAN  Take 5 mg by mouth 3 (three) times daily before meals.     mirtazapine 7.5 MG tablet  Commonly known as:  REMERON  Take 1 tablet (7.5 mg total) by mouth at bedtime. Take 1 tablets (7.5mg )  by mouth at bedtime to help depression, anxiety and sleep     naproxen sodium 220 MG tablet  Commonly known as:  ANAPROX  Take 220 mg by mouth 2 (two) times daily with a meal.     NIFEdipine 30 MG 24 hr tablet  Commonly known as:  PROCARDIA-XL/ADALAT CC  Take 30 mg by mouth every morning.     ondansetron 4 MG disintegrating tablet  Commonly known as:  ZOFRAN-ODT  Dissolve tab on the tongue every 6 hours for nausea.     polyethylene glycol powder powder  Commonly known as:  GLYCOLAX/MIRALAX  Once daily         Past Medical History  Diagnosis Date  . Unspecified essential hypertension   . Regional enteritis of unspecified site   . Hemorrhoids   . GERD (gastroesophageal reflux disease)   . Ruptured lumbar disc   . Adenomatous rectal polyp     Surgically removed  . Arthritis   . Chronic kidney disease     hx of cystitis   . Sun-damaged skin 01/12/12    " Sun spots removed from face and left wrist, right hand frozen"  . Anal and rectal polyp 12/03/2011  . Pain in joint, lower leg 07/23/2011  . Abnormal feces 06/25/2011  . Dysphagia, oropharyngeal phase 05/14/2011  . Syncope and collapse 02/24/2011  . Dizziness and giddiness 02/24/2011  . Abnormality of gait 12/23/2010  . Fecal smearing 12/23/2010  . Closed fracture of vertebral column 11/26/2010  . Other and unspecified hyperlipidemia 11/21/2010  . Other abnormal blood chemistry 11/21/2010  . Lumbago 11/20/2010  . Rash and other nonspecific skin eruption 11/20/2010  . Alopecia, unspecified 12/22/2009  . Abnormal blood sugar 06/01/2013  . Xerosis cutis 06/01/2013  . Neuroendocrine tumor of pancreas 10/26/2013  . Depression 2015  . Liver metastases 2015    From neuroendocrine tumor of the pancreas  . Hyponatremia 2015  . Constipation 2015  . Osteoarthritis of both knees 2015  . Memory deficit 2015    MMSE 26/35/17/15. Passed clock drawing.   Past Surgical History  Procedure Laterality Date  . Appendectomy    . Vaginal hysterectomy      still has ovaries  . Knee arthroscopy      Bi-lat  . Tonsillectomy and adenoidectomy    . Colonoscopy    . Polypectomy    . Mastectomy partial / lumpectomy Right     lump removed right breast at age 4   . Resection tubulovillous adenoma of rectum  2013    DATA REVIEWED  Radiologic Exams:   Cardiovascular Exams:   Laboratory Studies: Lab Results  Component Value Date   WBC 9.3 02/27/2014   HGB 11.9* 02/27/2014   HCT 35* 02/27/2014   MCV 89.6 09/09/2013   PLT 310 02/27/2014   Lab Results    Component Value Date   NA 131* 02/27/2014   K 3.6 02/27/2014   GLU 120 02/27/2014   BUN 11 02/27/2014   CREATININE 0.9 02/27/2014   Lab Results  Component Value Date   ALBUMIN 3.0 5/18//2015   AST 37* 02/27/2014   ALT 25 02/27/2014   ALKPHOS 119 02/27/2014      REVIEW OF SYSTEMS  DATA OBTAINED: from patient, GENERAL: Feels "terrible because of this pain"  No recent fever. Improved appetite,   RESPIRATORY: No cough, wheezing, SOB CARDIAC: No chest pain, palpitations. No edema GI: No Abdominal pain, Nausea, vomiting, diarrhea or constipation MUSCULOSKELETAL: Indicates pain bilateral groin, bilateral lateral thighs  and lower back. No recent falls  NEUROLOGIC: No dizziness, fainting, headache.  PSYCHIATRIC:  Sleeping better   No behavior issue   PHYSICAL EXAM Filed Vitals:   03/15/14 0923  BP: 114/68  Pulse: 60  Temp: 97.3 F (36.3 C)  TempSrc: Oral  Weight: 138 lb (62.596 kg)   Body mass index is 26.95 kg/(m^2).  GENERAL APPEARANCE: No acute distress, appropriately groomed, normal body habitus  Alert, pleasant, conversant. SKIN: No diaphoresis.   HEAD: Normocephalic, atraumatic EYES: Conjunctiva/lids clear  NOSE: No deformity or discharge. RESPIRATORY: Breathing is even, unlabored  Lung sounds are clear and full  CARDIOVASCULAR: Heart RRR   No murmur or extra heart sounds   ARTERIAL: 2+ palpable femoral pulses bilaterally   EDEMA: 1+ edema bilateral feet and lower legs GASTROINTESTINAL: Abdomen is soft, non-tender w/ gentle papation, not distended w/ normal bowel sounds.  MUSCULOSKELETAL: Tender bilateral groin. Significant pain with straight leg lift on each leg, pain is experienced from the groin to the thigh.  Significant tenderness over bilateral sacroiliac joint areas PSYCHIATRIC: Mood and affect are mildly depressed, "if I could just walk I'd be better"  ASSESSMENT/PLAN  Degenerative disc disease Patient with long-standing history of chronic back pain after fall/  lumbar vertebral fracture previously prior to 2012. An MRI result from 2011 revealed multilevel degenerative disc disease.  Over the last several years she has used a walker to help relieve her pain which was useful. She is also participated in OT interventions as well as water aerobics. She has been quite immobile for the last several months due to GI symptoms. Recently started working with PT but developed increased pain with pretty minimal exercise.  She denies any fall or other injuries.  Will obtain plain x-rays of lumbar spine and pelvis. Recommend further evaluation by orthopedics.   Time:  71minutes, >50% spent counseling/or care coordination   Follow up:  Return if symptoms worsen or fail to improve.   Mardene Celeste, NP-C [place] [phone]  03/15/2014

## 2014-03-15 NOTE — Assessment & Plan Note (Addendum)
Patient with long-standing history of chronic back pain after fall/ lumbar vertebral fracture previously prior to 2012. An MRI result from 2011 revealed multilevel degenerative disc disease.  Over the last several years she has used a walker to help relieve her pain which was useful. She is also participated in OT interventions as well as water aerobics. She has been quite immobile for the last several months due to GI symptoms. Recently started working with PT but developed increased pain with pretty minimal exercise.  She denies any fall or other injuries.  Will obtain plain x-rays of lumbar spine and pelvis. Recommend further evaluation by orthopedics.

## 2014-03-15 NOTE — Telephone Encounter (Signed)
Appointment with Dr. Lorin Mercy 03/31/14 at 2:45, for low back pain and decrease mobility.   Walk- in appt at [place] for Lumbar spine and pelvis.  Gave information to patient while still in clinic.

## 2014-03-20 ENCOUNTER — Telehealth: Payer: Self-pay | Admitting: Geriatric Medicine

## 2014-03-20 DIAGNOSIS — IMO0002 Reserved for concepts with insufficient information to code with codable children: Secondary | ICD-10-CM

## 2014-03-20 MED ORDER — LIDOCAINE 5 % EX PTCH: 1.0000 | MEDICATED_PATCH | CUTANEOUS | Status: DC

## 2014-03-20 NOTE — Telephone Encounter (Signed)
Phone call from patient. "Back pain is terrible...what can I use to help me?". Patient does not tolerate PO medication well. Will try Lidoderm patch. Appointment with Dr.Yates is scheduled 03/31/14

## 2014-03-21 ENCOUNTER — Telehealth: Payer: Self-pay | Admitting: *Deleted

## 2014-03-21 ENCOUNTER — Encounter: Payer: Self-pay | Admitting: Geriatric Medicine

## 2014-03-21 ENCOUNTER — Non-Acute Institutional Stay (SKILLED_NURSING_FACILITY): Payer: Medicare Other | Admitting: Geriatric Medicine

## 2014-03-21 DIAGNOSIS — R110 Nausea: Secondary | ICD-10-CM

## 2014-03-21 DIAGNOSIS — IMO0002 Reserved for concepts with insufficient information to code with codable children: Secondary | ICD-10-CM

## 2014-03-21 DIAGNOSIS — I10 Essential (primary) hypertension: Secondary | ICD-10-CM

## 2014-03-21 DIAGNOSIS — R5381 Other malaise: Secondary | ICD-10-CM

## 2014-03-21 MED ORDER — PREDNISONE 10 MG PO TABS: 10.0000 mg | ORAL_TABLET | ORAL | Status: AC

## 2014-03-21 NOTE — Progress Notes (Signed)
Patient ID: CLEONE HULICK, female   DOB: 1922-09-22, 80 y.o.   MRN: [id number]   [facility] [phone])  Code Status: Full Code      Contact Information   Name Relation Home Work [place] Daughter [phone]         Chief Complaint  Patient presents with  . Back Pain    HPI: This is a 80 y.o. female resident of [place], [place] section re-admitted to the Rehab section today due to worsening back pain and decreased functional status.  Last visit Degenerative disc disease Patient with long-standing history of chronic back pain after fall/ lumbar vertebral fracture  prior to 2012. An MRI result from 2011 revealed multilevel degenerative disc disease.  Over the last several years she has used a walker to help relieve her pain which was useful. She is also participated in OT interventions as well as water aerobics. She has been quite immobile for the last several months due to GI symptoms. Recently started working with PT but developed increased pain with pretty minimal exercise.  She denies any fall or other injuries.  Will obtain plain x-rays of lumbar spine and pelvis. Recommend further evaluation by orthopedics.  Since last visit X-ray results returned without evidence of acute fracture or other change in lower back/ pelvis. An appointment was arranged with Dr.Yates (originally 6/19, moved up to 6/16 yesterday). Patient called yesterday c/o severe back pain, Lidoderm patch was Rx (patient has not tolerated opioid medication). Patch started last night Patient was having significant difficulty managing at home, Rehab admission was recommended for increased supervision and care.    Allergies  Allergen Reactions  . Ciprofloxacin Nausea Only    headaches  . Codeine Nausea And Vomiting  . Darvocet [Propoxyphene N-Acetaminophen] Other (See Comments)    Caused her to pass out  . Darvon Other (See Comments)    Pt unsure  of reaction  . Protonix [Pantoprazole] Other (See Comments)    Dizziness and headaches    MEDICATION -    Medication List       This list is accurate as of: 03/21/14  2:07 PM.  Always use your most recent med list.               BYSTOLIC 10 MG tablet  Generic drug:  nebivolol  TAKE  ONE TABLET BY MOUTH DAILY TO TREAT HIGH BLOOD PRESSURE     dexlansoprazole 60 MG capsule  Commonly known as:  DEXILANT  Take 1 capsule (60 mg total) by mouth daily. (Samples from Dr. Hilarie Fredrickson)     lidocaine 5 %  Commonly known as:  LIDODERM  Place 1 patch onto the skin daily. Remove & Discard patch within 12 hours or as directed by MD     metoCLOPramide 5 MG tablet  Commonly known as:  REGLAN  Take 5 mg by mouth 3 (three) times daily before meals.     mirtazapine 7.5 MG tablet  Commonly known as:  REMERON  Take 1 tablet (7.5 mg total) by mouth at bedtime. Take 1 tablets (7.5mg )  by mouth at bedtime to help depression, anxiety and sleep     naproxen sodium 220 MG tablet  Commonly known as:  ANAPROX  Take 220 mg by mouth 2 (two) times daily with a meal.     NIFEdipine 30 MG 24 hr tablet  Commonly known as:  PROCARDIA-XL/ADALAT CC  Take 30 mg by mouth every morning.  ondansetron 4 MG disintegrating tablet  Commonly known as:  ZOFRAN-ODT  Dissolve tab on the tongue every 6 hours for nausea.     polyethylene glycol powder powder  Commonly known as:  GLYCOLAX/MIRALAX  Once daily     predniSONE 10 MG tablet  Commonly known as:  DELTASONE  Take 1 tablet (10 mg total) by mouth as directed. Take 40mg  x2 days, then 30mg  x2 days, then 20mg  x 2days, then 10mg  x2days,then stop        Past Medical History  Diagnosis Date  . Unspecified essential hypertension   . Regional enteritis of unspecified site   . Hemorrhoids   . GERD (gastroesophageal reflux disease)   . Ruptured lumbar disc   . Adenomatous rectal polyp     Surgically removed  . Arthritis   . Chronic kidney disease     hx of  cystitis   . Sun-damaged skin 01/12/12    " Sun spots removed from face and left wrist, right hand frozen"  . Anal and rectal polyp 12/03/2011  . Pain in joint, lower leg 07/23/2011  . Abnormal feces 06/25/2011  . Dysphagia, oropharyngeal phase 05/14/2011  . Syncope and collapse 02/24/2011  . Dizziness and giddiness 02/24/2011  . Abnormality of gait 12/23/2010  . Fecal smearing 12/23/2010  . Closed fracture of vertebral column 11/26/2010  . Other and unspecified hyperlipidemia 11/21/2010  . Other abnormal blood chemistry 11/21/2010  . Lumbago 11/20/2010  . Rash and other nonspecific skin eruption 11/20/2010  . Alopecia, unspecified 12/22/2009  . Abnormal blood sugar 06/01/2013  . Xerosis cutis 06/01/2013  . Neuroendocrine tumor of pancreas 10/26/2013  . Depression 2015  . Liver metastases 2015    From neuroendocrine tumor of the pancreas  . Hyponatremia 2015  . Constipation 2015  . Osteoarthritis of both knees 2015  . Memory deficit 2015    MMSE 26/35/17/15. Passed clock drawing.   Past Surgical History  Procedure Laterality Date  . Appendectomy    . Vaginal hysterectomy      still has ovaries  . Knee arthroscopy      Bi-lat  . Tonsillectomy and adenoidectomy    . Colonoscopy    . Polypectomy    . Mastectomy partial / lumpectomy Right     lump removed right breast at age 24   . Resection tubulovillous adenoma of rectum  2013    DATA REVIEWED  Radiologic Exams 03/15/2014 LUMBAR SPINE - COMPLETE 4+ VIEW FINDINGS:  Advanced degenerative lumbar spondylosis in the lower lumbar spine with degenerative disc disease and facet disease. Degenerative spondylolisthesis at L4 and L5. There is a remote L3 compression  fracture. No acute bony findings or destructive bony changes. Stable vascular calcifications. The visualized bony pelvis is intact  PELVIS - 1-2 VIEW FINDINGS:  Degenerative changes in the hips bilaterally, left greater than right with joint space narrowing and spurring. No acute bony  abnormality. Specifically, no fracture, subluxation, or dislocation. Soft tissues are intact    Cardiovascular Exams:   Laboratory Studies: Lab Results  Component Value Date   WBC 9.3 02/27/2014   HGB 11.9* 02/27/2014   HCT 35* 02/27/2014   MCV 89.6 09/09/2013   PLT 310 02/27/2014   Lab Results  Component Value Date   NA 131* 02/27/2014   K 3.6 02/27/2014   GLU 120 02/27/2014   BUN 11 02/27/2014   CREATININE 0.9 02/27/2014   Lab Results  Component Value Date   ALBUMIN 3.0 5/18//2015   AST 37* 02/27/2014  ALT 25 02/27/2014   ALKPHOS 119 02/27/2014     REVIEW OF SYSTEMS  DATA OBTAINED: from patient, GENERAL: Feels "terrible because of this pain"  No recent fever. Improved appetite,  Gained weight SKIN: No itch, rash or open wounds EYES: No eye pain, dryness or itching  No change in vision EARS: No earache, tinnitus, change in hearing NOSE: No congestion, drainage or bleeding MOUTH/THROAT: No mouth or tooth pain  No sore throat   No difficulty chewing or swallowing RESPIRATORY: No cough, wheezing, SOB CARDIAC: No chest pain, palpitations  No edema. GI: No abdominal pain  No nausea, vomiting,diarrhea or constipation  No heartburn or reflux  GU: No dysuria, frequency or urgency  No change in urine volume or character  MUSCULOSKELETAL: Indicates pain left buttock/hip/thigh with movement. Rt.hip/ buttock pain has nearly resolved.  No recent falls  NEUROLOGIC: No dizziness, fainting, headache.  Pins/needles in feet PSYCHIATRIC:  Sleeping better   No behavior issue   PHYSICAL EXAM Filed Vitals:   03/21/14 1352  BP: 155/88  Pulse: 87  Temp: 97.9 F (36.6 C)  Resp: 16  Height: 5' (1.524 m)  Weight: 139 lb 9.6 oz (63.322 kg)   Body mass index is 27.26 kg/(m^2).  GENERAL APPEARANCE: No acute distress, appropriately groomed, normal body habitus  Alert, pleasant, conversant. SKIN: No diaphoresis, rash, unusual lesions, wounds HEAD: Normocephalic, atraumatic EYES:  Conjunctiva/lids clear. Pupils round, reactive. EOMs intact.  EARS: External exam WNL, canals clear, TM WNL. Hearing grossly normal. NOSE: No deformity or discharge. MOUTH/THROAT: Lips w/o lesions. Oral mucosa, tongue moist, w/o lesion. Oropharynx w/o redness or lesions.  NECK: Supple, full ROM. No thyroid tenderness, enlargement or nodule LYMPHATICS: No head, neck or supraclavicular adenopathy RESPIRATORY: Breathing is even, unlabored. Lung sounds are clear and full.  CARDIOVASCULAR: Heart RRR. No murmur or extra heart sounds  ARTERIAL: DP,PT pulse notpalpable.  EDEMA: 2+ edema bilateral feet/ 1+  Lower legs GASTROINTESTINAL: Abdomen is soft, non-tender, not distended w/ normal bowel sounds. MUSCULOSKELETAL:  NonTender bilateral groin. Significant pain with straight leg lift on left,  pain is experienced in the groin, hip, buttock, thigh. Significant tenderness over left posterior buttock. No pain with leg lift on the right, mild tenderness rt. Buttock.  Able to stand fronm lift chair with great difficulty and pain in left hip/buttock PSYCHIATRIC: Mood and affect are mildly depressed, "I just can't stand this pain"  ASSESSMENT/PLAN  Degenerative disc disease Patient continues with significant pain rt hip/ buttock. Rt. Sided pain has nearly resolved. Appointment scheduled with Dr.Yates next week. Unclear if she experienced any benefit from Lidoderm patch. Will try prednisone for antiinflammatory effect.   HTN (hypertension) BP well controlled with current medication, continue current dosing.  Nausea alone No recurrence of nausea with current medication regimen. Continue.  Physical deconditioning Patient is having very bad pain with any movement, ambulation is very difficult placing her at increased risk to fall. Sh ewill benfit from supervision and assistance provided in Rehab section   Time:     Follow up:  Return for As needed.   Mardene Celeste, NP-C [place] [phone]  03/21/2014

## 2014-03-21 NOTE — Telephone Encounter (Signed)
Message from Candida Peeling requesting "formal order" for bone scan. Pt is having hip pain. Returned call, [name] misunderstood pt's daughter's request. Pt will see NP at [facility] and they will order any imaging for pain.

## 2014-03-21 NOTE — Assessment & Plan Note (Signed)
Patient is having very bad pain with any movement, ambulation is very difficult placing her at increased risk to fall. Sh ewill benfit from supervision and assistance provided in Rehab section

## 2014-03-21 NOTE — Assessment & Plan Note (Signed)
No recurrence of nausea with current medication regimen. Continue.

## 2014-03-21 NOTE — Assessment & Plan Note (Signed)
BP well controlled with current medication, continue current dosing.

## 2014-03-21 NOTE — Assessment & Plan Note (Signed)
Patient continues with significant pain rt hip/ buttock. Rt. Sided pain has nearly resolved. Appointment scheduled with Dr.Yates next week. Unclear if she experienced any benefit from Lidoderm patch. Will try prednisone for antiinflammatory effect.

## 2014-03-22 ENCOUNTER — Telehealth: Payer: Self-pay | Admitting: *Deleted

## 2014-03-22 NOTE — Telephone Encounter (Signed)
Message from pt's daughter requesting bone scan to be ordered today. Stated pt is unable to walk due to pain. Reviewed with Dr. Benay Spice: have PCP evaluate pain, can not order scan without seeing pt. Left message on voicemail with this info for daughter. Suggested she have NP or MD at [facility] contact Dr. Benay Spice if needed.

## 2014-03-24 ENCOUNTER — Encounter: Payer: Self-pay | Admitting: Internal Medicine

## 2014-03-30 ENCOUNTER — Non-Acute Institutional Stay (SKILLED_NURSING_FACILITY): Payer: Medicare Other | Admitting: Geriatric Medicine

## 2014-03-30 ENCOUNTER — Encounter: Payer: Self-pay | Admitting: Geriatric Medicine

## 2014-03-30 DIAGNOSIS — M5137 Other intervertebral disc degeneration, lumbosacral region: Secondary | ICD-10-CM

## 2014-03-30 DIAGNOSIS — M5136 Other intervertebral disc degeneration, lumbar region: Secondary | ICD-10-CM

## 2014-03-30 DIAGNOSIS — I10 Essential (primary) hypertension: Secondary | ICD-10-CM

## 2014-03-30 MED ORDER — ACETAMINOPHEN 325 MG PO TABS: 650.0000 mg | ORAL_TABLET | Freq: Three times a day (TID) | ORAL | Status: DC

## 2014-03-30 NOTE — Progress Notes (Signed)
Patient ID: Heather Parker, female   DOB: [birth year], 80 y.o.   MRN: [id number] Patient ID: Heather Parker, female   DOB: 16-Nov-1921, 80 y.o.   MRN: [id number]   [facility] [phone])  Code Status: Full Code      Contact Information   Name Relation Home Work [place] Daughter [phone]         Chief Complaint  Patient presents with  . Back Pain  . Deconditioning    HPI: This is a 80 y.o. female resident of [place], [facility] section re-admitted to the Rehab section today due to worsening back pain and decreased functional status.  Last visit Degenerative disc disease Patient continues with significant pain rt hip/ buttock. Rt. Sided pain has nearly resolved. Appointment scheduled with Dr.Yates next week. Unclear if she experienced any benefit from Lidoderm patch. Will try prednisone for antiinflammatory effect HTN (hypertension) BP well controlled with current medication, continue current dosing. Nausea alone No recurrence of nausea with current medication regimen. Continue. Physical deconditioning Patient is having very bad pain with any movement, ambulation is very difficult placing her at increased risk to fall. Sh ewill benfit from supervision and assistance provided in Rehab section  Since last visit patient completed prednisone taper, minimal improvement in left buttock/ leg pain. Was evaluated by DrYates 6/16. Verbal report via nurse : L3 compression fracture, questions bulging disc. Feels LE strengthening will reduce pain. Rx tramadol prn as well. She will return 04/18/14, if no improvement he will consider MRI. Tramadol causes sleepiness, patient does not like this effect.    Allergies  Allergen Reactions  . Ciprofloxacin Nausea Only    headaches  . Codeine Nausea And Vomiting  . Darvocet [Propoxyphene N-Acetaminophen] Other (See Comments)    Caused her to pass out  . Darvon Other (See Comments)   Pt unsure of reaction  . Protonix [Pantoprazole] Other (See Comments)    Dizziness and headaches    MEDICATION - Reviewed   DATA REVIEWED  Radiologic Exams 03/15/2014 LUMBAR SPINE - COMPLETE 4+ VIEW FINDINGS:  Advanced degenerative lumbar spondylosis in the lower lumbar spine with degenerative disc disease and facet disease. Degenerative spondylolisthesis at L4 and L5. There is a remote L3 compression  fracture. No acute bony findings or destructive bony changes. Stable vascular calcifications. The visualized bony pelvis is intact  PELVIS - 1-2 VIEW FINDINGS:  Degenerative changes in the hips bilaterally, left greater than right with joint space narrowing and spurring. No acute bony abnormality. Specifically, no fracture, subluxation, or dislocation. Soft tissues are intact    Cardiovascular Exams:   Laboratory Studies: Lab Results  Component Value Date   WBC 9.3 02/27/2014   HGB 11.9* 02/27/2014   HCT 35* 02/27/2014   MCV 89.6 09/09/2013   PLT 310 02/27/2014   Lab Results  Component Value Date   NA 131* 02/27/2014   K 3.6 02/27/2014   GLU 120 02/27/2014   BUN 11 02/27/2014   CREATININE 0.9 02/27/2014   Lab Results  Component Value Date   ALBUMIN 3.0 5/18//2015   AST 37* 02/27/2014   ALT 25 02/27/2014   ALKPHOS 119 02/27/2014     REVIEW OF SYSTEMS  DATA OBTAINED: from patient, nurse , medical record GENERAL: Feels "a little better, sleepy from the medicine"  No recent fever. Fair appetite, weight stable SKIN: No itch, rash or open wounds EYES: No eye pain, dryness or itching  No change in vision EARS: No earache,  tinnitus, change in hearing NOSE: No congestion, drainage or bleeding MOUTH/THROAT: No mouth or tooth pain  No sore throat   No difficulty chewing or swallowing RESPIRATORY: No cough, wheezing, SOB CARDIAC: No chest pain, palpitations  No edema. GI: No abdominal pain  No nausea, vomiting,diarrhea or constipation  No heartburn or reflux  Medicine keeps nausea  under control GU: No dysuria, frequency or urgency  No change in urine volume or character  MUSCULOSKELETAL: Indicates pain left buttock/hip/thigh with movement. Rt.hip/ buttock pain has resolved.  No recent falls  NEUROLOGIC: No dizziness, fainting, headache.    PSYCHIATRIC:  Sleeping better   No behavior issue   PHYSICAL EXAM Filed Vitals:   03/30/14 1531  BP: 164/78  Pulse: 87  Weight: 137 lb (62.143 kg)   Body mass index is 26.76 kg/(m^2).  GENERAL APPEARANCE: No acute distress, appropriately groomed, normal body habitus  Alert, pleasant, conversant. SKIN: No diaphoresis, rash, unusual lesions, wounds HEAD: Normocephalic, atraumatic EYES: Conjunctiva/lids clear.  EARS: Hearing grossly normal. NOSE: No deformity or discharge. RESPIRATORY: Breathing is even, unlabored. Lung sounds are clear and full.  CARDIOVASCULAR: Heart RRR. No murmur or extra heart sounds  EDEMA: No LE edema  GASTROINTESTINAL: Abdomen is soft, non-tender, not distended w/ normal bowel sounds.  MUSCULOSKELETAL:   Pain with straight leg lift on left, is able to lift higher than last visit. Pain is experienced in the  hip, buttock, thigh. Mild  tenderness over left posterior buttock. No pain with leg lift on the right,  Able to stand from chair with minimal difficulty and pain in left hip/buttock. Pain persists while standing, she is standing steady PSYCHIATRIC: Mood and affect are mildly depressed  ASSESSMENT/PLAN  HTN (hypertension) Intermittent SBP readings >150, lilkley related to pain. Continue to monitor, continue current medication.  Degenerative disc disease Continues with significant pain / debility due to left buttock, hip//thigh pain. Minimal improvement after prednisone taper. Dr.Yates' opinion is possible bulging disc. Favors conservative approach. Tramadol makes patient too sleepy. Will schedule Tylenol, use tramadol prn. Encourage standing/ very short walking.      Follow up:  Return for As  needed.   Mardene Celeste, NP-C [place] [phone]  03/30/2014

## 2014-03-30 NOTE — Assessment & Plan Note (Signed)
Intermittent SBP readings >150, lilkley related to pain. Continue to monitor, continue current medication.

## 2014-03-30 NOTE — Assessment & Plan Note (Signed)
Continues with significant pain / debility due to left buttock, hip//thigh pain. Minimal improvement after prednisone taper. Dr.Yates' opinion is possible bulging disc. Favors conservative approach. Tramadol makes patient too sleepy. Will schedule Tylenol, use tramadol prn. Encourage standing/ very short walking.

## 2014-04-05 ENCOUNTER — Other Ambulatory Visit: Payer: Self-pay | Admitting: Orthopaedic Surgery

## 2014-04-05 DIAGNOSIS — M545 Low back pain, unspecified: Secondary | ICD-10-CM

## 2014-04-12 ENCOUNTER — Ambulatory Visit
Admission: RE | Admit: 2014-04-12 | Discharge: 2014-04-12 | Disposition: A | Discharge status: Home / Self Care | Payer: Medicare Other | Source: Ambulatory Visit | Attending: Orthopaedic Surgery | Admitting: Orthopaedic Surgery

## 2014-04-12 DIAGNOSIS — M545 Low back pain, unspecified: Secondary | ICD-10-CM

## 2014-04-14 ENCOUNTER — Emergency Department (HOSPITAL_COMMUNITY)
Admission: EM | Admit: 2014-04-14 | Discharge: 2014-04-14 | Disposition: A | Discharge status: Home / Self Care | Payer: Medicare Other | Attending: Emergency Medicine | Admitting: Emergency Medicine

## 2014-04-14 ENCOUNTER — Encounter (HOSPITAL_COMMUNITY): Payer: Self-pay | Admitting: Emergency Medicine

## 2014-04-14 DIAGNOSIS — K219 Gastro-esophageal reflux disease without esophagitis: Secondary | ICD-10-CM | POA: Insufficient documentation

## 2014-04-14 DIAGNOSIS — Z87828 Personal history of other (healed) physical injury and trauma: Secondary | ICD-10-CM | POA: Insufficient documentation

## 2014-04-14 DIAGNOSIS — M1710 Unilateral primary osteoarthritis, unspecified knee: Secondary | ICD-10-CM | POA: Insufficient documentation

## 2014-04-14 DIAGNOSIS — IMO0002 Reserved for concepts with insufficient information to code with codable children: Secondary | ICD-10-CM | POA: Insufficient documentation

## 2014-04-14 DIAGNOSIS — I129 Hypertensive chronic kidney disease with stage 1 through stage 4 chronic kidney disease, or unspecified chronic kidney disease: Secondary | ICD-10-CM | POA: Insufficient documentation

## 2014-04-14 DIAGNOSIS — Z8509 Personal history of malignant neoplasm of other digestive organs: Secondary | ICD-10-CM | POA: Insufficient documentation

## 2014-04-14 DIAGNOSIS — Z8505 Personal history of malignant neoplasm of liver: Secondary | ICD-10-CM | POA: Insufficient documentation

## 2014-04-14 DIAGNOSIS — F329 Major depressive disorder, single episode, unspecified: Secondary | ICD-10-CM | POA: Insufficient documentation

## 2014-04-14 DIAGNOSIS — R1110 Vomiting, unspecified: Secondary | ICD-10-CM | POA: Insufficient documentation

## 2014-04-14 DIAGNOSIS — Z8639 Personal history of other endocrine, nutritional and metabolic disease: Secondary | ICD-10-CM | POA: Insufficient documentation

## 2014-04-14 DIAGNOSIS — M129 Arthropathy, unspecified: Secondary | ICD-10-CM | POA: Insufficient documentation

## 2014-04-14 DIAGNOSIS — N189 Chronic kidney disease, unspecified: Secondary | ICD-10-CM | POA: Insufficient documentation

## 2014-04-14 DIAGNOSIS — Z860100 Personal history of colon polyps, unspecified: Secondary | ICD-10-CM | POA: Insufficient documentation

## 2014-04-14 DIAGNOSIS — Z79899 Other long term (current) drug therapy: Secondary | ICD-10-CM | POA: Insufficient documentation

## 2014-04-14 DIAGNOSIS — Z862 Personal history of diseases of the blood and blood-forming organs and certain disorders involving the immune mechanism: Secondary | ICD-10-CM | POA: Insufficient documentation

## 2014-04-14 DIAGNOSIS — Z872 Personal history of diseases of the skin and subcutaneous tissue: Secondary | ICD-10-CM | POA: Insufficient documentation

## 2014-04-14 DIAGNOSIS — F3289 Other specified depressive episodes: Secondary | ICD-10-CM | POA: Insufficient documentation

## 2014-04-14 DIAGNOSIS — R110 Nausea: Secondary | ICD-10-CM | POA: Insufficient documentation

## 2014-04-14 DIAGNOSIS — Z791 Long term (current) use of non-steroidal anti-inflammatories (NSAID): Secondary | ICD-10-CM | POA: Insufficient documentation

## 2014-04-14 DIAGNOSIS — K625 Hemorrhage of anus and rectum: Secondary | ICD-10-CM

## 2014-04-14 LAB — COMPREHENSIVE METABOLIC PANEL
ALBUMIN: 2.9 g/dL — AB (ref 3.5–5.2)
ALT: 22 U/L (ref 0–35)
AST: 45 U/L — AB (ref 0–37)
Alkaline Phosphatase: 241 U/L — ABNORMAL HIGH (ref 39–117)
Anion gap: 14 (ref 5–15)
BUN: 16 mg/dL (ref 6–23)
CO2: 20 mEq/L (ref 19–32)
CREATININE: 0.77 mg/dL (ref 0.50–1.10)
Calcium: 9.7 mg/dL (ref 8.4–10.5)
Chloride: 94 mEq/L — ABNORMAL LOW (ref 96–112)
GFR calc Af Amer: 83 mL/min — ABNORMAL LOW (ref >90)
GFR calc non Af Amer: 72 mL/min — ABNORMAL LOW (ref >90)
Glucose, Bld: 114 mg/dL — ABNORMAL HIGH (ref 70–99)
Potassium: 5.1 mEq/L (ref 3.7–5.3)
Sodium: 128 mEq/L — ABNORMAL LOW (ref 137–147)
TOTAL PROTEIN: 6.3 g/dL (ref 6.0–8.3)
Total Bilirubin: 0.2 mg/dL — ABNORMAL LOW (ref 0.3–1.2)

## 2014-04-14 LAB — CBC
HEMATOCRIT: 33.8 % — AB (ref 36.0–46.0)
Hemoglobin: 11.5 g/dL — ABNORMAL LOW (ref 12.0–15.0)
MCH: 28 pg (ref 26.0–34.0)
MCHC: 34 g/dL (ref 30.0–36.0)
MCV: 82.2 fL (ref 78.0–100.0)
PLATELETS: 376 10*3/uL (ref 150–400)
RBC: 4.11 MIL/uL (ref 3.87–5.11)
RDW: 13.1 % (ref 11.5–15.5)
WBC: 8.6 10*3/uL (ref 4.0–10.5)

## 2014-04-14 NOTE — ED Provider Notes (Signed)
CSN: [id number]     Arrival date & time 04/14/14  1318 History   First MD Initiated Contact with Patient 04/14/14 1328     Chief Complaint  Patient presents with  . Rectal Bleeding     (Consider location/radiation/quality/duration/timing/severity/associated sxs/prior Treatment) HPI  Heather Parker is a 80 y.o. female who states that she had a bowel movement this morning, and noticed some bleeding. She felt that it was from her hemorrhoids. A nurse examined her and thought she had a rectal prolapse, so sent her here for evaluation. The patient denies recent fever, chills, nausea, vomiting, weakness, dizziness, back, pain or problems ambulating. She denies constipation. She is taking her usual medications. There are no other known modifying factors.   Past Medical History  Diagnosis Date  . Unspecified essential hypertension   . Regional enteritis of unspecified site   . Hemorrhoids   . GERD (gastroesophageal reflux disease)   . Ruptured lumbar disc   . Adenomatous rectal polyp     Surgically removed  . Arthritis   . Chronic kidney disease     hx of cystitis   . Sun-damaged skin 01/12/12    " Sun spots removed from face and left wrist, right hand frozen"  . Anal and rectal polyp 12/03/2011  . Pain in joint, lower leg 07/23/2011  . Abnormal feces 06/25/2011  . Dysphagia, oropharyngeal phase 05/14/2011  . Syncope and collapse 02/24/2011  . Dizziness and giddiness 02/24/2011  . Abnormality of gait 12/23/2010  . Fecal smearing 12/23/2010  . Closed fracture of vertebral column 11/26/2010  . Other and unspecified hyperlipidemia 11/21/2010  . Other abnormal blood chemistry 11/21/2010  . Lumbago 11/20/2010  . Rash and other nonspecific skin eruption 11/20/2010  . Alopecia, unspecified 12/22/2009  . Abnormal blood sugar 06/01/2013  . Xerosis cutis 06/01/2013  . Neuroendocrine tumor of pancreas 10/26/2013  . Depression 2015  . Liver metastases 2015    From neuroendocrine tumor of the pancreas  .  Hyponatremia 2015  . Constipation 2015  . Osteoarthritis of both knees 2015  . Memory deficit 2015    MMSE 26/35/17/15. Passed clock drawing.   Past Surgical History  Procedure Laterality Date  . Appendectomy    . Vaginal hysterectomy      still has ovaries  . Knee arthroscopy      Bi-lat  . Tonsillectomy and adenoidectomy    . Colonoscopy    . Polypectomy    . Mastectomy partial / lumpectomy Right     lump removed right breast at age 52   . Resection tubulovillous adenoma of rectum  2013   Family History  Problem Relation Age of Onset  . Stomach cancer Sister   . Lung cancer Brother   . Heart disease Father   . Stroke Father   . Colon cancer Neg Hx    History  Substance Use Topics  . Smoking status: Never Smoker   . Smokeless tobacco: Never Used  . Alcohol Use: No   OB History   Grav Para Term Preterm Abortions TAB SAB Ect Mult Living                 Review of Systems  All other systems reviewed and are negative.     Allergies  Ciprofloxacin; Codeine; Darvocet; Darvon; and Protonix  Home Medications   Prior to Admission medications   Medication Sig Start Date End Date Taking? Authorizing Provider  acetaminophen (TYLENOL) 325 MG tablet Take 2 tablets (650 mg total)  by mouth 3 (three) times daily. 03/30/14  Yes Claudette T Levie Heritage, NP  BYSTOLIC 10 MG tablet TAKE  ONE TABLET BY MOUTH DAILY TO TREAT HIGH BLOOD PRESSURE 06/06/13  Yes Tiffany L Reed, DO  dexlansoprazole (DEXILANT) 60 MG capsule Take 1 capsule (60 mg total) by mouth daily. (Samples from Dr. Hilarie Fredrickson) 01/10/14  Yes Jerene Bears, MD  lidocaine (LIDODERM) 5 % Place 1 patch onto the skin daily. Remove & Discard patch within 12 hours or as directed by MD 03/20/14  Yes Claudette Jeri Cos, NP  metoCLOPramide (REGLAN) 5 MG tablet Take 5 mg by mouth 3 (three) times daily before meals. 02/24/14  Yes Claudette Jeri Cos, NP  mirtazapine (REMERON) 7.5 MG tablet Take 1 tablet (7.5 mg total) by mouth at bedtime. Take 1  tablets (7.5mg )  by mouth at bedtime to help depression, anxiety and sleep 01/10/14  Yes Jerene Bears, MD  naproxen sodium (ANAPROX) 220 MG tablet Take 220 mg by mouth 2 (two) times daily with a meal.   Yes Claudette T Levie Heritage, NP  NIFEdipine (PROCARDIA-XL/ADALAT CC) 30 MG 24 hr tablet Take 30 mg by mouth every morning.    Yes Historical Provider, MD  ondansetron (ZOFRAN-ODT) 4 MG disintegrating tablet Dissolve tab on the tongue every 6 hours for nausea. 01/10/14  Yes Mardene Celeste, NP  polyethylene glycol powder (GLYCOLAX/MIRALAX) powder Once daily 11/30/13  Yes Historical Provider, MD  traMADol (ULTRAM) 50 MG tablet Take 50 mg by mouth 2 (two) times daily as needed for moderate pain or severe pain. 03/28/14  Yes Marybelle Killings, MD   BP 119/56  Pulse 78  Temp(Src) 98.2 F (36.8 C) (Oral)  Resp 14  SpO2 100% Physical Exam  Nursing note and vitals reviewed. Constitutional: She is oriented to person, place, and time. She appears well-developed and well-nourished. No distress.  HENT:  Head: Normocephalic and atraumatic.  Eyes: Conjunctivae and EOM are normal. Pupils are equal, round, and reactive to light.  Neck: Normal range of motion and phonation normal. Neck supple.  Cardiovascular: Normal rate, regular rhythm and intact distal pulses.   Pulmonary/Chest: Effort normal and breath sounds normal. She exhibits no tenderness.  Abdominal: Soft. She exhibits no distension. There is no tenderness. There is no guarding.  Genitourinary:  No external hemorrhoids. There is a small amount of redundant tissue around the anus that is consistent with old hemorrhoidal tissue. There is a small posterior internal hemorrhoid that is not actively bleeding. There is no rectal mass. There is brown stool in the rectum. There is no fecal impaction  Musculoskeletal: Normal range of motion.  Neurological: She is alert and oriented to person, place, and time. She exhibits normal muscle tone.  Skin: Skin is warm and dry.   Psychiatric: She has a normal mood and affect. Her behavior is normal. Judgment and thought content normal.    ED Course  Procedures (including critical care time)     Labs Review Labs Reviewed  CBC - Abnormal; Notable for the following:    Hemoglobin 11.5 (*)    HCT 33.8 (*)    All other components within normal limits  COMPREHENSIVE METABOLIC PANEL - Abnormal; Notable for the following:    Sodium 128 (*)    Chloride 94 (*)    Glucose, Bld 114 (*)    Albumin 2.9 (*)    AST 45 (*)    Alkaline Phosphatase 241 (*)    Total Bilirubin 0.2 (*)    GFR calc non  Af Amer 72 (*)    GFR calc Af Amer 83 (*)    All other components within normal limits  POC OCCULT BLOOD, ED    Imaging Review No results found.   EKG Interpretation None      MDM   Final diagnoses:  Rectal bleeding    Rectal bleeding, likely hemorrhoidal. There is no evidence for prolapse of the rectal tissue.   Nursing Notes Reviewed/ Care Coordinated Applicable Imaging Reviewed Interpretation of Laboratory Data incorporated into ED treatment  The patient appears reasonably screened and/or stabilized for discharge and I doubt any other medical condition or other [facility] requiring further screening, evaluation, or treatment in the ED at this time prior to discharge.  Plan: Home Medications- usual; Home Treatments- rest; return here if the recommended treatment, does not improve the symptoms; Recommended follow up- PCP 1 week     Richarda Blade, MD 04/14/14 [phone]

## 2014-04-14 NOTE — ED Notes (Signed)
Bed: WA09 Expected date:  Expected time:  Means of arrival:  Comments: EMS- 80yo F, rectal bleeding

## 2014-04-14 NOTE — ED Notes (Signed)
Per EMS pt comes from [facility] c/o rectal bleeding. Pt has PMH prolapsed rectum and hemorrhoids.  Pt states that she hasnt had BM in "little while".  Pt has bright red blood in her pad this morning when she woke up. Pt denies any pain at this time.

## 2014-04-14 NOTE — Discharge Instructions (Signed)
There is a small amount of bleeding, today. You do not have a rectal prolapse. You do have small internal hemorrhoid. Talk to your doctor about an inappropriate stool softener to keep your stools, regular and soft.     Bloody Stools Bloody stools often mean that there is a problem in the digestive tract. Your caregiver may use the term "melena" to describe black, tarry, and bad smelling stools or "hematochezia" to describe red or maroon-colored stools. Blood seen in the stool can be caused by bleeding anywhere along the intestinal tract.  A black stool usually means that blood is coming from the upper part of the gastrointestinal tract (esophagus, stomach, or small bowel). Passing maroon-colored stools or bright red blood usually means that blood is coming from lower down in the large bowel or the rectum. However, sometimes massive bleeding in the stomach or small intestine can cause bright red bloody stools.  Consuming black licorice, lead, iron pills, medicines containing bismuth subsalicylate, or blueberries can also cause black stools. Your caregiver can test black stools to see if blood is present. It is important that the cause of the bleeding be found. Treatment can then be started, and the problem can be corrected. Rectal bleeding may not be serious, but you should not assume everything is okay until you know the cause.It is very important to follow up with your caregiver or a specialist in gastrointestinal problems. CAUSES  Blood in the stools can come from various underlying causes.Often, the cause is not found during your first visit. Testing is often needed to discover the cause of bleeding in the gastrointestinal tract. Causes range from simple to serious or even life-threatening.Possible causes include:  Hemorrhoids.These are veins that are full of blood (engorged) in the rectum. They cause pain, inflammation, and may bleed.  Anal fissures.These are areas of painful tearing which  may bleed. They are often caused by passing hard stool.  Diverticulosis.These are pouches that form on the colon over time, with age, and may bleed significantly.  Diverticulitis.This is inflammation in areas with diverticulosis. It can cause pain, fever, and bloody stools, although bleeding is rare.  Proctitis and colitis. These are inflamed areas of the rectum or colon. They may cause pain, fever, and bloody stools.  Polyps and cancer. Colon cancer is a leading cause of preventable cancer death.It often starts out as precancerous polyps that can be removed during a colonoscopy, preventing progression into cancer. Sometimes, polyps and cancer may cause rectal bleeding.  Gastritis and ulcers.Bleeding from the upper gastrointestinal tract (near the stomach) may travel through the intestines and produce black, sometimes tarry, often bad smelling stools. In certain cases, if the bleeding is fast enough, the stools may not be black, but red and the condition may be life-threatening. SYMPTOMS  You may have stools that are bright red and bloody, that are normal color with blood on them, or that are dark black and tarry. In some cases, you may only have blood in the toilet bowl. Any of these cases need medical care. You may also have:  Pain at the anus or anywhere in the rectum.  Lightheadedness or feeling faint.  Extreme weakness.  Nausea or vomiting.  Fever. DIAGNOSIS Your caregiver may use the following methods to find the cause of your bleeding:  Taking a medical history. Age is important. Older people tend to develop polyps and cancer more often. If there is anal pain and a hard, large stool associated with bleeding, a tear of the anus may  be the cause. If blood drips into the toilet after a bowel movement, bleeding hemorrhoids may be the problem. The color and frequency of the bleeding are additional considerations. In most cases, the medical history provides clues, but seldom the final  answer.  A visual and finger (digital) exam. Your caregiver will inspect the anal area, looking for tears and hemorrhoids. A finger exam can provide information when there is tenderness or a growth inside. In men, the prostate is also examined.  Endoscopy. Several types of small, long scopes (endoscopes) are used to view the colon.  In the office, your caregiver may use a rigid, or more commonly, a flexible viewing sigmoidoscope. This exam is called flexible sigmoidoscopy. It is performed in 5 to 10 minutes.  A more thorough exam is accomplished with a colonoscope. It allows your caregiver to view the entire 5 to 6 foot long colon. Medicine to help you relax (sedative) is usually given for this exam. Frequently, a bleeding lesion may be present beyond the reach of the sigmoidoscope. So, a colonoscopy may be the best exam to start with. Both exams are usually done on an outpatient basis. This means the patient does not stay overnight in the hospital or surgery center.  An upper endoscopy may be needed to examine your stomach. Sedation is used and a flexible endoscope is put in your mouth, down to your stomach.  A barium enema X-ray. This is an X-ray exam. It uses liquid barium inserted by enema into the rectum. This test alone may not identify an actual bleeding point. X-rays highlight abnormal shadows, such as those made by lumps (tumors), diverticuli, or colitis. TREATMENT  Treatment depends on the cause of your bleeding.   For bleeding from the stomach or colon, the caregiver doing your endoscopy or colonoscopy may be able to stop the bleeding as part of the procedure.  Inflammation or infection of the colon can be treated with medicines.  Many rectal problems can be treated with creams, suppositories, or warm baths.  Surgery is sometimes needed.  Blood transfusions are sometimes needed if you have lost a lot of blood.  For any bleeding problem, let your caregiver know if you take aspirin  or other blood thinners regularly. HOME CARE INSTRUCTIONS   Take any medicines exactly as prescribed.  Keep your stools soft by eating a diet high in fiber. Prunes (1 to 3 a day) work well for many people.  Drink enough water and fluids to keep your urine clear or pale yellow.  Take sitz baths if advised. A sitz bath is when you sit in a bathtub with warm water for 10 to 15 minutes to soak, soothe, and cleanse the rectal area.  If enemas or suppositories are advised, be sure you know how to use them. Tell your caregiver if you have problems with this.  Monitor your bowel movements to look for signs of improvement or worsening. SEEK MEDICAL CARE IF:   You do not improve in the time expected.  Your condition worsens after initial improvement.  You develop any new symptoms. SEEK IMMEDIATE MEDICAL CARE IF:   You develop severe or prolonged rectal bleeding.  You vomit blood.  You feel weak or faint.  You have a fever. MAKE SURE YOU:  Understand these instructions.  Will watch your condition.  Will get help right away if you are not doing well or get worse. Document Released: 09/19/2002 Document Revised: 12/22/2011 Document Reviewed: 02/14/2011 [facility] Patient Information 2015 [place], [place]. This  information is not intended to replace advice given to you by your health care provider. Make sure you discuss any questions you have with your health care provider. ° °

## 2014-04-17 LAB — POC OCCULT BLOOD, ED: FECAL OCCULT BLD: NEGATIVE

## 2014-04-18 ENCOUNTER — Telehealth: Payer: Self-pay

## 2014-04-18 NOTE — Telephone Encounter (Signed)
Dr. Nyoka Cowden received notice that the patient went to the ER 04/14/14 for rectal bleeding, told me to call patient to make follow-up appt in office at [facility]. Left message on patient's recorder to call the office for appt.

## 2014-04-19 ENCOUNTER — Telehealth: Payer: Self-pay | Admitting: *Deleted

## 2014-04-19 NOTE — Telephone Encounter (Signed)
Left VM asking for staff to call office tomorrow with more specific information on her status-v/s, weight, intake,pain, etc. Noted she was in ER on 7/3 with rectal bleeding and was instructed to follow up with her PCP in 1 week.

## 2014-04-19 NOTE — Telephone Encounter (Signed)
Patient is requesting appointment to see Dr. Benay Spice as soon as possible. Not eating and barely drinking. Getting weaker and says MD said he would be glad "to work her in anytime" she needs to be seen.

## 2014-04-20 ENCOUNTER — Telehealth: Payer: Self-pay

## 2014-04-20 NOTE — Telephone Encounter (Signed)
Called and informed nurse at [facility] patient to to see Marlynn Perking tomorrow 04/21/14 at 1345 per Dr.Sherrill.

## 2014-04-21 ENCOUNTER — Telehealth: Payer: Self-pay | Admitting: Oncology

## 2014-04-21 ENCOUNTER — Telehealth: Payer: Self-pay | Admitting: Internal Medicine

## 2014-04-21 ENCOUNTER — Ambulatory Visit (HOSPITAL_BASED_OUTPATIENT_CLINIC_OR_DEPARTMENT_OTHER): Payer: Medicare Other | Admitting: Nurse Practitioner

## 2014-04-21 VITALS — BP 150/82 | HR 87 | Temp 99.1°F | Resp 18 | Ht 60.0 in | Wt 135.3 lb

## 2014-04-21 DIAGNOSIS — R932 Abnormal findings on diagnostic imaging of liver and biliary tract: Secondary | ICD-10-CM

## 2014-04-21 DIAGNOSIS — R630 Anorexia: Secondary | ICD-10-CM

## 2014-04-21 DIAGNOSIS — D3A8 Other benign neuroendocrine tumors: Secondary | ICD-10-CM

## 2014-04-21 DIAGNOSIS — C7A00 Malignant carcinoid tumor of unspecified site: Secondary | ICD-10-CM

## 2014-04-21 DIAGNOSIS — C787 Secondary malignant neoplasm of liver and intrahepatic bile duct: Secondary | ICD-10-CM

## 2014-04-21 DIAGNOSIS — R110 Nausea: Secondary | ICD-10-CM

## 2014-04-21 MED ORDER — DEXLANSOPRAZOLE 60 MG PO CPDR: 60.0000 mg | DELAYED_RELEASE_CAPSULE | Freq: Every day | ORAL | Status: DC

## 2014-04-21 MED ORDER — PROCHLORPERAZINE MALEATE 5 MG PO TABS: 5.0000 mg | ORAL_TABLET | Freq: Three times a day (TID) | ORAL | Status: AC | PRN

## 2014-04-21 NOTE — Progress Notes (Addendum)
  [place] OFFICE PROGRESS NOTE   Diagnosis:  Metastatic neuroendocrine tumor  INTERVAL HISTORY:   Heather Parker recently contacted the office requesting a visit for evaluation of anorexia and nausea.  Her daughter reports onset of back pain about 8 weeks ago. She was diagnosed with sacral insufficiency fractures. The pain is beginning to improved. She is taking tramadol and Tylenol.  Over the past 2 weeks she has been experiencing anorexia and increased nausea. Before 2 weeks ago the nausea was controlled with scheduled Zofran and Reglan. Bowels have been moving. She denies diarrhea. No fever or flushing. No significant abdominal pain.  Objective:  Vital signs in last 24 hours:  Blood pressure 150/82, pulse 87, temperature 99.1 F (37.3 C), temperature source Oral, resp. rate 18, height 5' (1.524 m), weight 135 lb 4.8 oz (61.372 kg).    HEENT: No thrush or ulcerations. Lymphatics: No palpable cervical, supraclavicular or axillary lymph nodes. Resp: Lungs clear. Cardio: Regular cardiac rhythm. GI: Abdomen is soft and nontender. Liver is palpable 3-4 fingerbreadths below the right costal margin. Vascular: No leg edema.    Lab Results:  Lab Results  Component Value Date   WBC 8.6 04/14/2014   HGB 11.5* 04/14/2014   HCT 33.8* 04/14/2014   MCV 82.2 04/14/2014   PLT 376 04/14/2014   NEUTROABS 6.8 08/22/2013    Imaging:  No results found.  Medications: I have reviewed the patient's current medications.  Assessment/Plan: 1. Metastatic well differentiated neuroendocrine tumor involving a liver biopsy 09/09/2013. 2. Tail of the pancreas mass on MRI 09/15/2013. 3. Hypertension. 4. 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 5. Fatigue. 6. Nausea and anorexia likely secondary to #1.    Disposition: Heather Parker presents with a 2 week history of anorexia and worsening nausea. She and her daughter understand these symptoms  are likely due to progression of metastatic disease involving the liver. The pain medicine she has been taking for the sacral insufficiency fractures may also be contributing.  Her pain is improving. She will try to limit tramadol as possible. We gave instructions to increase Zofran to 8 mg 3 times daily. She was given a prescription for Compazine 5 mg every 8 hours as needed. If these measures are not effective we will consider a trial of Marinol and/or Dexamethasone.   She will return for a follow-up visit on 05/10/2014.    Patient seen with Dr. Benay Spice. 25 minutes were spent face-to-face at today's visit with the majority of that time involved in counseling/coordination of care.    Ned Card ANP/GNP-BC   04/21/2014  3:06 PM This was a shared a visit with Ned Card.Heather Parker was interviewed and examined.  Heather Parker appears to have progression of the metastatic neuroendocrine tumor involving the liver. She has a poor performance status and is not a candidate for systemic therapy. We adjusted the anti-emetic regimen. Her daughter will contact us next week if this is not helpful.  Julieanne Manson, M.D.

## 2014-04-21 NOTE — Telephone Encounter (Signed)
gv pt appt schedule for july °

## 2014-04-21 NOTE — Patient Instructions (Addendum)
Limit Tramadol if possible to see if this helps with the nausea. Increase Zofran to 8 mg three times daily for nausea. Compazine 5 mg every 8 hours as needed for nausea.

## 2014-04-21 NOTE — Telephone Encounter (Signed)
2 boxes samples left for the patient.   I have explained to the patient's daughter that this will be the last samples due to new sample policy

## 2014-04-24 ENCOUNTER — Encounter: Payer: Self-pay | Admitting: Internal Medicine

## 2014-04-25 ENCOUNTER — Telehealth: Payer: Self-pay

## 2014-04-25 LAB — BASIC METABOLIC PANEL
BUN: 13 mg/dL (ref 4–21)
Creatinine: 0.7 mg/dL (ref 0.5–1.1)
Glucose: 47 mg/dL
Potassium: 4.6 mmol/L (ref 3.4–5.3)
Sodium: 126 mmol/L — AB (ref 137–147)

## 2014-04-25 LAB — HEPATIC FUNCTION PANEL
ALK PHOS: 159 U/L — AB (ref 25–125)
ALT: 22 U/L (ref 7–35)
AST: 41 U/L — AB (ref 13–35)

## 2014-04-25 LAB — CBC AND DIFFERENTIAL
HEMATOCRIT: 31 % — AB (ref 36–46)
Hemoglobin: 10.5 g/dL — AB (ref 12.0–16.0)
PLATELETS: 344 10*3/uL (ref 150–399)
WBC: 7.5 10*3/mL

## 2014-04-25 NOTE — Telephone Encounter (Signed)
Left second  Message, for patient to call office to make appt for ER follow-up rectal bleeding ER visit 04/14/14.

## 2014-04-28 ENCOUNTER — Telehealth: Payer: Self-pay

## 2014-04-28 NOTE — Telephone Encounter (Signed)
[facility] nurse to ask about Heather Parker, she was admitted to Rehab on 03/30/14. Was in rehab when she had the rectal bleeding and went to the ER, she then returned to Rehab. That's why she hasn't returned my call.

## 2014-05-02 ENCOUNTER — Non-Acute Institutional Stay (SKILLED_NURSING_FACILITY): Payer: Medicare Other | Admitting: Nurse Practitioner

## 2014-05-02 ENCOUNTER — Telehealth: Payer: Self-pay | Admitting: *Deleted

## 2014-05-02 DIAGNOSIS — D3A8 Other benign neuroendocrine tumors: Secondary | ICD-10-CM

## 2014-05-02 DIAGNOSIS — M51369 Other intervertebral disc degeneration, lumbar region without mention of lumbar back pain or lower extremity pain: Secondary | ICD-10-CM

## 2014-05-02 DIAGNOSIS — C7A098 Malignant carcinoid tumors of other sites: Secondary | ICD-10-CM

## 2014-05-02 DIAGNOSIS — K219 Gastro-esophageal reflux disease without esophagitis: Secondary | ICD-10-CM

## 2014-05-02 DIAGNOSIS — I10 Essential (primary) hypertension: Secondary | ICD-10-CM

## 2014-05-02 DIAGNOSIS — M5136 Other intervertebral disc degeneration, lumbar region: Secondary | ICD-10-CM

## 2014-05-02 DIAGNOSIS — F3289 Other specified depressive episodes: Secondary | ICD-10-CM

## 2014-05-02 DIAGNOSIS — M5137 Other intervertebral disc degeneration, lumbosacral region: Secondary | ICD-10-CM

## 2014-05-02 DIAGNOSIS — R110 Nausea: Secondary | ICD-10-CM

## 2014-05-02 DIAGNOSIS — F32A Depression, unspecified: Secondary | ICD-10-CM

## 2014-05-02 DIAGNOSIS — F329 Major depressive disorder, single episode, unspecified: Secondary | ICD-10-CM

## 2014-05-02 NOTE — Progress Notes (Signed)
Patient ID: Heather Parker, female   DOB: 1921/11/04, 80 y.o.   MRN: [id number]   [facility] [phone])  Code Status: Full Code  Contact Information   Name Relation Home Work [place] Daughter [phone]         Chief Complaint  Patient presents with  . Medical Management of Chronic Issues    HPI: This is a 80 y.o. female resident of [facility], Independent [facility] section who is being seen today for routine follow up on chronic conditions and lab review.  Since last visit pt has been seen due to nausea, abd pain and dizziness, Dr Nyoka Cowden saw pt for acute visit and stopped tramadol. This has improved since. Pt also reports she is not taking mirtazapine, it is ordered and on medication review she has only received this medication 3 times in ~2 weeks.  She reports she did not think she would wake up after taking it the last time. Does not like how it makes her feel. Staff reports an increase in PO intake over the last few weeks since her nausea medication was adjusted.  Pt with recent labs showing a trend down in sodium.   Allergies  Allergen Reactions  . Ciprofloxacin Nausea Only    headaches  . Codeine Nausea And Vomiting  . Darvocet [Propoxyphene N-Acetaminophen] Other (See Comments)    Caused her to pass out  . Darvon Other (See Comments)    Pt unsure of reaction  . Protonix [Pantoprazole] Other (See Comments)    Dizziness and headaches    MEDICATION - Current Outpatient Prescriptions on File Prior to Visit  Medication Sig Dispense Refill  . acetaminophen (TYLENOL) 325 MG tablet Take 2 tablets (650 mg total) by mouth 3 (three) times daily.      Marland Kitchen BYSTOLIC 10 MG tablet TAKE  ONE TABLET BY MOUTH DAILY TO TREAT HIGH BLOOD PRESSURE  90 tablet  2  . dexlansoprazole (DEXILANT) 60 MG capsule Take 1 capsule (60 mg total) by mouth daily. (Samples from Dr. Hilarie Fredrickson)  10 capsule  0  . lidocaine (LIDODERM) 5 % Place 1 patch onto the  skin daily. Remove & Discard patch within 12 hours or as directed by MD  30 patch  1  . metoCLOPramide (REGLAN) 5 MG tablet Take 5 mg by mouth 3 (three) times daily before meals.      . mirtazapine (REMERON) 7.5 MG tablet Take 1 tablet (7.5 mg total) by mouth at bedtime. Take 1 tablets (7.5mg )  by mouth at bedtime to help depression, anxiety and sleep  30 tablet  3  . naproxen sodium (ANAPROX) 220 MG tablet Take 220 mg by mouth 2 (two) times daily with a meal.      . NIFEdipine (PROCARDIA-XL/ADALAT CC) 30 MG 24 hr tablet Take 30 mg by mouth every morning.       . ondansetron (ZOFRAN-ODT) 4 MG disintegrating tablet Dissolve tab on the tongue every 6 hours for nausea.      . polyethylene glycol powder (GLYCOLAX/MIRALAX) powder Once daily      . prochlorperazine (COMPAZINE) 5 MG tablet Take 1 tablet (5 mg total) by mouth every 8 (eight) hours as needed for nausea or vomiting.  30 tablet  0  . traMADol (ULTRAM) 50 MG tablet Take 50 mg by mouth 2 (two) times daily as needed for moderate pain or severe pain.       No current facility-administered medications on file prior to visit.  DATA REVIEWED  Radiologic Exams 03/15/2014 LUMBAR SPINE - COMPLETE 4+ VIEW FINDINGS:  Advanced degenerative lumbar spondylosis in the lower lumbar spine with degenerative disc disease and facet disease. Degenerative spondylolisthesis at L4 and L5. There is a remote L3 compression  fracture. No acute bony findings or destructive bony changes. Stable vascular calcifications. The visualized bony pelvis is intact  PELVIS - 1-2 VIEW FINDINGS:  Degenerative changes in the hips bilaterally, left greater than right with joint space narrowing and spurring. No acute bony abnormality. Specifically, no fracture, subluxation, or dislocation. Soft tissues are intact   Laboratory Studies: Lab Results  Component Value Date   WBC 7.5 04/25/2014   HGB 10.5* 04/25/2014   HCT 31* 04/25/2014   MCV 82.2 04/14/2014   PLT 344  04/25/2014     Chemistry      Component Value Date/Time   NA 126* 04/25/2014   NA 128* 04/14/2014 1333   K 4.6 04/25/2014   CL 94* 04/14/2014 1333   CO2 20 04/14/2014 1333   BUN 13 04/25/2014   BUN 16 04/14/2014 1333   CREATININE 0.7 04/25/2014   CREATININE 0.77 04/14/2014 1333   GLU 47 04/25/2014      Component Value Date/Time   CALCIUM 9.7 04/14/2014 1333   ALKPHOS 159* 04/25/2014   AST 41* 04/25/2014   ALT 22 04/25/2014   BILITOT 0.2* 04/14/2014 1333       REVIEW OF SYSTEMS  DATA OBTAINED: from patient, nurse , medical record GENERAL: Feels ok  No recent fever. Fair appetite, weight stable SKIN: No itch, rash or open wounds MOUTH/THROAT: No mouth or tooth pain  No sore throat   No difficulty chewing or swallowing RESPIRATORY: No cough, wheezing, SOB CARDIAC: No chest pain, palpitations  No edema. GI: No abdominal pain  No nausea, vomiting,diarrhea or constipation  No heartburn or reflux  Medicine has helped the nausea GU: No dysuria, frequency or urgency  No change in urine volume or character  MUSCULOSKELETAL: Indicates pain left buttock/hip/thigh with movement. Rt.hip/ buttock pain has improved.  No recent falls  NEUROLOGIC: No dizziness, fainting, headache.    PSYCHIATRIC:  Sleeping better   No behavior issue   PHYSICAL EXAM Filed Vitals:   05/02/14 0928  BP: 123/67  Pulse: 82  Temp: 97.6 F (36.4 C)  Resp: 20  Weight: 133 lb (60.328 kg)  SpO2: 96%   Body mass index is 25.97 kg/(m^2).  GENERAL APPEARANCE: No acute distress, appropriately groomed, normal body habitus  Alert, pleasant, conversant. SKIN: No diaphoresis, rash, unusual lesions, wounds HEAD: Normocephalic, atraumatic EYES: Conjunctiva/lids clear.  EARS: Hearing grossly normal. NOSE: No deformity or discharge. RESPIRATORY: Breathing is even, unlabored. Lung sounds are clear and full.  CARDIOVASCULAR: Heart RRR. No murmur or extra heart sounds  EDEMA: No LE edema  GASTROINTESTINAL: Abdomen is soft, non-tender,  not distended w/ normal bowel sounds.  MUSCULOSKELETAL: no tenderness over buttock.  Able to stand from chair with minimal difficulty, gait is steady with walker PSYCHIATRIC: Mood and affect are mildly depressed  ASSESSMENT/PLAN  1. Essential hypertension -with some elevations in blood pressure however most are within appropriate range, will cont to monitor   2. Degeneration of intervertebral disc of lumbar region -pain is improved  3. Nausea alone -has improve with current regimen   4. Neuroendocrine tumor of pancreas -conts to follow with Dr Benay Spice oncology, next appt is next week  5. Depression -unchanged, pt previously placed on mirtazapine to increase appetite but pt declines medications making her "  feel like she is in another world" does not want to take this. Will dc at this time.   6. Gastroesophageal reflux disease without esophagitis Stable at this time  7. hyponatremia Oncology aware, report this is due to neuroendocrine tumor.  Will follow up at her appt next week

## 2014-05-02 NOTE — Telephone Encounter (Addendum)
Per Dr. Benay Spice; notified [name], RN at [facility] that MD said low sodium likely d/t tumor in liver, liver failure; would not do any further work up at this time unless PCP wants to.  Asked if pt is drinking enough fluids throughout the day--[name] reports she is; mainly drinks water & Gatorade.  [name] verbalized understanding of information and stated she would pass on to their NP.  Confirmed appt next week 05/10/14 @ 9:45.

## 2014-05-02 NOTE — Telephone Encounter (Signed)
Received message from [name], [place] @ [facility] to call re: pt.   Spoke with [name], RN; requesting advice from Dr. Benay Spice re: sodium level trending down.  "is this due to the tumor?" Should we do a work-up before pt comes to MD appt 05/10/14;  Should we give her salt tablets?"  Note given to Dr. Benay Spice.

## 2014-05-10 ENCOUNTER — Telehealth: Payer: Self-pay | Admitting: Oncology

## 2014-05-10 ENCOUNTER — Ambulatory Visit (HOSPITAL_BASED_OUTPATIENT_CLINIC_OR_DEPARTMENT_OTHER): Payer: Medicare Other | Admitting: Nurse Practitioner

## 2014-05-10 VITALS — BP 149/65 | HR 83 | Temp 98.4°F | Resp 18 | Ht 60.0 in | Wt 132.2 lb

## 2014-05-10 DIAGNOSIS — E871 Hypo-osmolality and hyponatremia: Secondary | ICD-10-CM

## 2014-05-10 DIAGNOSIS — R110 Nausea: Secondary | ICD-10-CM

## 2014-05-10 DIAGNOSIS — C7A00 Malignant carcinoid tumor of unspecified site: Secondary | ICD-10-CM

## 2014-05-10 DIAGNOSIS — C787 Secondary malignant neoplasm of liver and intrahepatic bile duct: Secondary | ICD-10-CM

## 2014-05-10 DIAGNOSIS — R630 Anorexia: Secondary | ICD-10-CM

## 2014-05-10 DIAGNOSIS — R109 Unspecified abdominal pain: Secondary | ICD-10-CM

## 2014-05-10 DIAGNOSIS — D3A8 Other benign neuroendocrine tumors: Secondary | ICD-10-CM

## 2014-05-10 MED ORDER — DEXAMETHASONE 4 MG PO TABS: ORAL_TABLET | ORAL | Status: DC

## 2014-05-10 MED ORDER — HYDROCODONE-ACETAMINOPHEN 5-325 MG PO TABS: 1.0000 | ORAL_TABLET | Freq: Four times a day (QID) | ORAL | Status: AC | PRN

## 2014-05-10 NOTE — Telephone Encounter (Signed)
Pt confirmed labs/ov per 07/29 POF, gave pt AVS...KJ °

## 2014-05-10 NOTE — Patient Instructions (Signed)
Change zofran to 8 mg every eight hours as needed for nausea.

## 2014-05-10 NOTE — Progress Notes (Addendum)
[place] OFFICE PROGRESS NOTE   Diagnosis:  Metastatic neuroendocrine tumor.  INTERVAL HISTORY:   Heather Parker returns as scheduled. She continues to have nausea despite scheduled Zofran and as needed Compazine. She is also on Reglan. She complains of an "achy" sensation at the right abdomen. The pain related to the sacral insufficiency fractures is better. She is no longer taking tramadol. Appetite is poor. Energy level is poor. She reports that she "sleeps all the time". Bowels are moving. She notes loose stools after taking MiraLAX.  Her daughter reports a recent low sodium level.   Objective:  Vital signs in last 24 hours:  Blood pressure 149/65, pulse 83, temperature 98.4 F (36.9 C), temperature source Oral, resp. rate 18, height 5' (1.524 m), weight 132 lb 3.2 oz (59.966 kg).    HEENT: No thrush or ulcerations. Lymphatics: No palpable cervical or supraclavicular lymph nodes. Resp: Rales at both lung bases. No respiratory distress. Cardio: Regular cardiac rhythm, occasional premature beat. GI: Abdomen is soft. Liver palpable approximately 4 fingerbreadths below the right costal margin. Vascular: No leg edema. Neuro: Alert and oriented. Follows commands.    Lab Results:  Lab Results  Component Value Date   WBC 7.5 04/25/2014   HGB 10.5* 04/25/2014   HCT 31* 04/25/2014   MCV 82.2 04/14/2014   PLT 344 04/25/2014   NEUTROABS 6.8 08/22/2013   04/25/2014 sodium 126, glucose 47, BUN 13, creatinine 0.7, potassium 4.6.  Medications: I have reviewed the patient's current medications.  Assessment/Plan: 1. Metastatic well differentiated neuroendocrine tumor involving a liver biopsy 09/09/2013. 2. Tail of the pancreas mass on MRI 09/15/2013. 3. Hypertension. 4. 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 5. Fatigue. 6. Nausea and anorexia likely secondary to #1.  7. Abdominal pain likely secondary to liver  metastases. 8. Hyponatremia, likely secondary to liver dysfunction, malnutrition.   Disposition: Heather Parker performance status continues to decline. She and her daughter understand the overall decline in her condition, as well as the nausea, anorexia and abdominal pain are all due to progression of the cancer. They are in agreement she is not a candidate for systemic therapy. We recommended a supportive care approach with a hospice referral.  As she currently resides in a nursing facility they do not feel she needs hospice.  For the nausea as well as pain she will begin dexamethasone 4 mg twice daily for 4 days then 4 mg daily. We also prescribed Norco 5/325 one tablet every 6 hours as needed for pain. Zofran will be adjusted to every 8 hours as needed for nausea.  The hyponatremia is likely related to a combination of liver dysfunction and malnutrition. She does not appear symptomatic. No further workup is planned.  She will return for a followup visit on 05/19/2014.  Patient seen with Dr. Benay Spice. 35 minutes were spent face-to-face at today's visit with the majority of the time involved in counseling/coordination of care.  Ned Card ANP/GNP-BC   05/10/2014  10:30 AM  This was a shared visit with Ned Card. Heather Parker has a metastatic neuroendocrine carcinoma involving the liver. Her performance status continues to decline. I suspect her symptoms are related to progressive disease in the liver. We discussed the poor prognosis with her daughter. She declines a hospice referral at present. We adjusted the anti-emetic regimen and added hydrocodone today.Heather Parker will return for an office visit next week.  I do not recommend aggressive diagnostic evaluation of the  hyponatremia. The hyponatremia is most likely related to liver dysfunction.  Julieanne Manson, M.D.

## 2014-05-17 ENCOUNTER — Telehealth: Payer: Self-pay | Admitting: *Deleted

## 2014-05-17 NOTE — Telephone Encounter (Signed)
Received phone call from patient's daughter Vaughan Basta) stating that patient was feeling better and wanted to know if she needed to keep her appointment with Dr. Benay Spice on 05/19/14.  Informed patient's daughter to keep appointment.  Per Elby Showers. Thomas.  Patient's daughter verbalized understanding.

## 2014-05-19 ENCOUNTER — Ambulatory Visit (HOSPITAL_BASED_OUTPATIENT_CLINIC_OR_DEPARTMENT_OTHER): Payer: Medicare Other | Admitting: Oncology

## 2014-05-19 ENCOUNTER — Telehealth: Payer: Self-pay | Admitting: Oncology

## 2014-05-19 ENCOUNTER — Other Ambulatory Visit: Payer: Self-pay | Admitting: *Deleted

## 2014-05-19 VITALS — BP 124/69 | HR 90 | Temp 98.4°F | Resp 18 | Ht 60.0 in | Wt 133.7 lb

## 2014-05-19 DIAGNOSIS — C787 Secondary malignant neoplasm of liver and intrahepatic bile duct: Secondary | ICD-10-CM

## 2014-05-19 DIAGNOSIS — R000 Tachycardia, unspecified: Secondary | ICD-10-CM

## 2014-05-19 DIAGNOSIS — C7A1 Malignant poorly differentiated neuroendocrine tumors: Secondary | ICD-10-CM

## 2014-05-19 DIAGNOSIS — E871 Hypo-osmolality and hyponatremia: Secondary | ICD-10-CM

## 2014-05-19 DIAGNOSIS — C7A00 Malignant carcinoid tumor of unspecified site: Secondary | ICD-10-CM

## 2014-05-19 DIAGNOSIS — I4891 Unspecified atrial fibrillation: Secondary | ICD-10-CM

## 2014-05-19 MED ORDER — CLOTRIMAZOLE 10 MG MT TROC: 10.0000 mg | Freq: Four times a day (QID) | OROMUCOSAL | Status: AC

## 2014-05-19 NOTE — Progress Notes (Signed)
  [place] OFFICE PROGRESS NOTE   Diagnosis: Metastatic neuroendocrine tumor  INTERVAL HISTORY:   Heather Parker returns as scheduled. She reports marketed improvement in nausea and abdominal pain since starting Decadron. She is taking only 2 hydrocodone tablets in the last week. No nausea. She has been able to go to the dining hall for meals.  Objective:  Vital signs in last 24 hours:  Blood pressure 124/69, pulse 90, temperature 98.4 F (36.9 C), temperature source Oral, resp. rate 18, height 5' (1.524 m), weight 133 lb 11.2 oz (60.646 kg), SpO2 100.00%.    HEENT: Mild thrush over the tongue and buccal mucosa Resp: Lungs clear bilaterally Cardio: Irregular GI: The liver is palpable in the right upper abdomen, nontender Vascular: No leg edema  Medications: I have reviewed the patient's current medications.  Assessment/Plan: 1. Metastatic well differentiated neuroendocrine tumor involving a liver biopsy 09/09/2013. 2. Tail of the pancreas mass on MRI 09/15/2013. 3. Hypertension. 4. 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 5. Fatigue. 6. Nausea and anorexia likely secondary to #1. Improved with Decadron.  7. Abdominal pain likely secondary to liver metastases. Improved with Decadron. 8. Hyponatremia, likely secondary to liver dysfunction, malnutrition. 9. Atrial fibrillation-irregular heart rate noted on exam today with an EKG confirming atrial fibrillation 10. Oral candidiasis   Disposition:  Ms. Housh has an improved performance status since starting Decadron. We tapered the Decadron to 2 mg daily. She will change Tylenol and Reglan to as needed. She will discontinue naproxen while taking Decadron. We will and Mycelex troches while on Decadron.  The EKG from today will be forwarded to Dr. Nyoka Cowden. She does not appear symptomatic from the atrial fibrillation.  Ms. Laurance Flatten normal return problems visit in 3  weeks.  The medication list was reviewed in detail with her daughter.   Betsy Coder, MD  05/19/2014  11:13 AM

## 2014-05-19 NOTE — Telephone Encounter (Signed)
gv and printed appt sched and avs for pt for

## 2014-05-19 NOTE — Telephone Encounter (Signed)
Spoke with [place] at [facility], informed of EKG being faxed to Dr. Rolly Salter attention. She confirms receipt of medication changes and EKG result earlier today. Rx for Mycelex faxed as well.  Of note, pt's daughter left message requesting medication for "thrush" as well.

## 2014-05-26 ENCOUNTER — Telehealth: Payer: Self-pay | Admitting: Internal Medicine

## 2014-05-26 MED ORDER — OMEPRAZOLE 40 MG PO CPDR: 40.0000 mg | DELAYED_RELEASE_CAPSULE | Freq: Every day | ORAL | Status: AC

## 2014-05-26 NOTE — Telephone Encounter (Signed)
Dr Hilarie Fredrickson,  Dexilant is too costly for patient. It appears per your last office visit note in 12/2013 that patient was taking the Dexilant every morning as well as zantac at night. We told her she may increase the zantac to twice daily when needed. It also appears that she was to follow up with you in the office 2-3 months from that last visit. Are you okay with me switching her to another PPI of her insurance's choice (she has already tried protonix) until she can follow up in the office???

## 2014-05-26 NOTE — Telephone Encounter (Signed)
rx for omeprazole sent to fax # provided by [facility].

## 2014-05-26 NOTE — Telephone Encounter (Signed)
I have spoken to Heather Parker @ [facility] and have suggested that she contact insurance company to find out which PPI they will cover. She will do this and call us back. Advised patient may follow up with [place] as needed.

## 2014-05-26 NOTE — Telephone Encounter (Signed)
Yes we can try another PPI Followup can be as needed, she follows closely with her facility practitioner I hope she is doing as well as possible

## 2014-06-08 ENCOUNTER — Telehealth: Payer: Self-pay | Admitting: Oncology

## 2014-06-08 ENCOUNTER — Ambulatory Visit (HOSPITAL_BASED_OUTPATIENT_CLINIC_OR_DEPARTMENT_OTHER): Payer: Medicare Other | Admitting: Oncology

## 2014-06-08 VITALS — BP 151/79 | HR 98 | Temp 98.4°F | Resp 20 | Ht 60.0 in | Wt 130.1 lb

## 2014-06-08 DIAGNOSIS — C7A00 Malignant carcinoid tumor of unspecified site: Secondary | ICD-10-CM

## 2014-06-08 DIAGNOSIS — C787 Secondary malignant neoplasm of liver and intrahepatic bile duct: Secondary | ICD-10-CM

## 2014-06-08 DIAGNOSIS — D3A8 Other benign neuroendocrine tumors: Secondary | ICD-10-CM

## 2014-06-08 DIAGNOSIS — B370 Candidal stomatitis: Secondary | ICD-10-CM

## 2014-06-08 NOTE — Addendum Note (Signed)
Addended by: Tania Ade on: 06/08/2014 05:41 PM   Modules accepted: Orders, Medications

## 2014-06-08 NOTE — Progress Notes (Signed)
  [place] OFFICE PROGRESS NOTE   Diagnosis: Metastatic pancreas neuroendocrine tumor  INTERVAL HISTORY:   Heather Parker returns as scheduled. She denies pain and nausea. Good appetite. Her energy level remains poor. She is not talking much, but she is able to talk. She takes nausea medicine and pain medication and frequently.  Objective:  Vital signs in last 24 hours:  Blood pressure 151/79, pulse 98, temperature 98.4 F (36.9 C), temperature source Oral, resp. rate 20, height 5' (1.524 m), weight 130 lb 1.6 oz (59.013 kg).    HEENT: No thrush, mild whitecoat at the posterior tongue Resp: Scattered and inspiratory rhonchi, no respiratory distress Cardio: Irregular GI: The liver is palpable in the right upper abdomen, nontender Vascular: Trace pitting edema at the lower leg bilaterally Neuro: Alert and oriented to month. Not oriented to place or year  , speaks, follows commands, face is symmetric   Medications: I have reviewed the patient's current medications.  Assessment/Plan: 1. Metastatic well differentiated neuroendocrine tumor involving a liver biopsy 09/09/2013. 2. Tail of the pancreas mass on MRI 09/15/2013. 3. Hypertension. 4. 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 5. Fatigue. 6. Nausea and anorexia likely secondary to #1. Improved with Decadron.  7. Abdominal pain likely secondary to liver metastases. Improved with Decadron. 8. Hyponatremia, likely secondary to liver dysfunction, malnutrition. 9. Atrial fibrillation-irregular heart rate noted on exam today with an EKG confirming atrial fibrillation 10. Oral candidiasis-improved, continue Mycelex troches   Disposition:  Her performance status appears to be slowly declining. I recommend continuing comfort care. She will change to Prilosec from [place] secondary to cost. She will continue Decadron at a dose of 2 mg daily. I suspect her lack of  conversation is related to hepatic encephalopathy and a global decline in her performance status. I have a low clinical suspicion for a CVA or brain metastases.   [name] will return for an office visit in one month.  Betsy Coder, MD  06/08/2014  4:24 PM

## 2014-06-08 NOTE — Telephone Encounter (Signed)
Pt confirmed ov per 08/27 POF. gave pt AVS....KJ

## 2014-07-10 ENCOUNTER — Ambulatory Visit (HOSPITAL_BASED_OUTPATIENT_CLINIC_OR_DEPARTMENT_OTHER): Payer: Medicare Other | Admitting: Nurse Practitioner

## 2014-07-10 ENCOUNTER — Telehealth: Payer: Self-pay | Admitting: Nurse Practitioner

## 2014-07-10 VITALS — BP 107/49 | HR 90 | Temp 98.4°F | Resp 18 | Ht 60.0 in | Wt 126.8 lb

## 2014-07-10 DIAGNOSIS — D3A8 Other benign neuroendocrine tumors: Secondary | ICD-10-CM

## 2014-07-10 DIAGNOSIS — E871 Hypo-osmolality and hyponatremia: Secondary | ICD-10-CM

## 2014-07-10 DIAGNOSIS — R110 Nausea: Secondary | ICD-10-CM

## 2014-07-10 DIAGNOSIS — C787 Secondary malignant neoplasm of liver and intrahepatic bile duct: Secondary | ICD-10-CM

## 2014-07-10 DIAGNOSIS — C7A098 Malignant carcinoid tumors of other sites: Secondary | ICD-10-CM

## 2014-07-10 DIAGNOSIS — R630 Anorexia: Secondary | ICD-10-CM

## 2014-07-10 DIAGNOSIS — I4891 Unspecified atrial fibrillation: Secondary | ICD-10-CM

## 2014-07-10 DIAGNOSIS — R109 Unspecified abdominal pain: Secondary | ICD-10-CM

## 2014-07-10 DIAGNOSIS — B370 Candidal stomatitis: Secondary | ICD-10-CM

## 2014-07-10 NOTE — Progress Notes (Addendum)
  [place] OFFICE PROGRESS NOTE   Diagnosis: Metastatic pancreas neuroendocrine tumor    INTERVAL HISTORY:   Heather Parker returns as scheduled. She denies pain. No significant nausea/vomiting. She occasionally takes Compazine or Zofran. Appetite has been decreased. Her daughter reports Heather Parker is less conversant and seems confused at times. She is also concerned about a sacral decubitus ulcer.  Objective:  Vital signs in last 24 hours:  Blood pressure 107/49, pulse 90, temperature 98.4 F (36.9 C), temperature source Oral, resp. rate 18, height 5' (1.524 m), weight 126 lb 12.8 oz (57.516 kg), SpO2 99.00%.    HEENT: No thrush or ulcers. Mucous membranes appear moist. Resp: Rhonchi at the lung bases. No respiratory distress. Cardio: Irregular. GI: Abdomen is soft and nontender. Vascular: No leg edema. Neuro: She is alert. Mildly confused. Follows commands.  Skin: Ecchymoses scattered over extremities. Erythema with superficial skin breakdown at the low sacrum/upper gluteal fold.   Lab Results:  Lab Results  Component Value Date   WBC 7.5 04/25/2014   HGB 10.5* 04/25/2014   HCT 31* 04/25/2014   MCV 82.2 04/14/2014   PLT 344 04/25/2014   NEUTROABS 6.8 08/22/2013    Imaging:  No results found.  Medications: I have reviewed the patient's current medications.  Assessment/Plan: 1. Metastatic well differentiated neuroendocrine tumor involving a liver biopsy 09/09/2013. 2. Tail of the pancreas mass on MRI 09/15/2013. 3. Hypertension. 4. 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 5. Fatigue. 6. Nausea and anorexia likely secondary to #1. Improved with Decadron.  7. Abdominal pain likely secondary to liver metastases. Improved with Decadron. 8. Hyponatremia, likely secondary to liver dysfunction, malnutrition. 9. Atrial fibrillation-irregular heart rate noted on exam today with an EKG confirming atrial  fibrillation 10. Oral candidiasis-improved, continue Mycelex troches   Disposition: Heather Parker performance status is slowly declining. Plan to continue a comfort care approach.   We will request a skin care evaluation of the early sacral decubitus ulcer.  Blood pressure today was decreased from her baseline. We recommend consideration of discontinuing blood pressure medications with the cancer diagnosis/prognosis. Will defer to Dr. Nyoka Cowden.  We scheduled a followup visit in 4 weeks. We will be happy to see her sooner if the need arises.    Patient seen with Dr. Benay Spice.    Ned Card ANP/GNP-BC   07/10/2014  11:18 AM  This with a shared visit with Ned Card.  We discussed Heather Parker's status with her daughter. The plan is to continue comfort/supportive care.  Julieanne Manson, M.D.

## 2014-07-10 NOTE — Telephone Encounter (Signed)
Pt confirmed labs/ov per 09/28 POF, gave pt AVS...... KJ °

## 2014-07-24 ENCOUNTER — Telehealth: Payer: Self-pay | Admitting: Oncology

## 2014-07-24 NOTE — Telephone Encounter (Signed)
[place] w/[facility] called per pt's daughter pt wants to come in earlier per MD ok if needed. Pt is having nausea worse and confusion. [place] confirmed new apt time..... KJ

## 2014-07-28 ENCOUNTER — Ambulatory Visit (HOSPITAL_BASED_OUTPATIENT_CLINIC_OR_DEPARTMENT_OTHER): Payer: Medicare Other | Admitting: Nurse Practitioner

## 2014-07-28 VITALS — BP 122/62 | HR 102 | Temp 99.4°F | Resp 18 | Ht 60.0 in | Wt 133.6 lb

## 2014-07-28 DIAGNOSIS — C7A8 Other malignant neuroendocrine tumors: Secondary | ICD-10-CM

## 2014-07-28 DIAGNOSIS — R110 Nausea: Secondary | ICD-10-CM

## 2014-07-28 DIAGNOSIS — D3A8 Other benign neuroendocrine tumors: Secondary | ICD-10-CM

## 2014-07-28 MED ORDER — DEXAMETHASONE 4 MG PO TABS: 4.0000 mg | ORAL_TABLET | Freq: Every day | ORAL | Status: AC

## 2014-07-28 NOTE — Progress Notes (Signed)
  [place] OFFICE PROGRESS NOTE   Diagnosis:  Metastatic pancreas neuroendocrine tumor   INTERVAL HISTORY:   Heather Parker returns prior to scheduled followup. Her daughter called and requested a sooner appointment due to increased nausea. About 1 week ago she began having fairly consistent nausea. She is taking Compazine one or 2 times a day and Zofran usually once a day. She reports these medications temporarily relieve the nausea. Appetite is poor due to a combination of anorexia and nausea. Bowels moving regularly. No constipation or diarrhea. She has intermittent abdominal pain.  Objective:  Vital signs in last 24 hours:  Blood pressure 122/62, pulse 102, temperature 99.4 F (37.4 C), temperature source Oral, resp. rate 18, height 5' (1.524 m), weight 133 lb 9.6 oz (60.601 kg), SpO2 97.00%.    HEENT: No thrush or ulcers. Resp: Faint rales right lung base. No respiratory distress. Cardio: Regular rate and rhythm. GI: Abdomen is soft and nontender. Vascular: No leg edema. Neuro: She is alert. Appears mildly confused. Follows commands.  Skin: Ecchymoses scattered over extremities and upper chest.    Lab Results:  Lab Results  Component Value Date   WBC 7.5 04/25/2014   HGB 10.5* 04/25/2014   HCT 31* 04/25/2014   MCV 82.2 04/14/2014   PLT 344 04/25/2014   NEUTROABS 6.8 08/22/2013    Imaging:  No results found.  Medications: I have reviewed the patient's current medications.  Assessment/Plan: 1. Metastatic well differentiated neuroendocrine tumor involving a liver biopsy 09/09/2013. 2. Tail of the pancreas mass on MRI 09/15/2013. 3. Hypertension. 4. 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 5. Fatigue. 6. Nausea and anorexia likely secondary to #1. Initially improved with Decadron.  7. Abdominal pain likely secondary to liver metastases. Improved with Decadron. 8. Hyponatremia, likely secondary to liver  dysfunction, malnutrition. 9. Atrial fibrillation-irregular heart rate noted on exam today with an EKG confirming atrial fibrillation 10. Oral candidiasis-improved, continue Mycelex troches   Disposition: Heather Parker performance status continues to decline. She is having increased nausea. The nausea is most likely secondary to metastatic disease involving the liver. We will increase the dexamethasone to 4 mg daily. She will continue Compazine and Zofran as needed. We can consider low-dose Ativan at a dose of 0.5 mg every 8 hours as needed for nausea if the above measures are not effective.  We discussed a hospice referral to help with symptom management. Heather Parker and her daughter are in agreement. We will contact the [facility] program.  Heather Parker will keep her next scheduled appointment on 08/07/2014.  Plan reviewed with Dr. Benay Spice. 25 minutes were spent face-to-face at today's visit that the majority of the time involved in counseling/coordination of care.  Ned Card ANP/GNP-BC   07/28/2014  10:50 AM

## 2014-08-04 ENCOUNTER — Telehealth: Payer: Self-pay | Admitting: *Deleted

## 2014-08-04 NOTE — Telephone Encounter (Signed)
[place] from [facility] [facility] called in reference to pt appt Monday with MD; "is this appt necessary since pt under Hospice care now and MD saw pt last week?"  Per Dr. Benay Spice; call and spoke with [facility]---is not necessary for MD to see pt Monday and appt will be cancelled.  If daughter wants pt to be see here; MD will be glad to see and she can call scheduling to make appt. for next month.  [place] verbalized understanding.

## 2014-08-07 ENCOUNTER — Ambulatory Visit: Payer: Medicare Other | Admitting: Oncology

## 2014-08-13 DEATH — deceased

## 2014-11-02 ENCOUNTER — Encounter: Payer: Self-pay | Admitting: Internal Medicine

## 2015-02-14 NOTE — Progress Notes (Signed)
This encounter was created in error - please disregard.
